# Patient Record
Sex: Female | Born: 1990 | Race: White | Hispanic: No | Marital: Single | Smoking: Former smoker
Health system: Southern US, Community
[De-identification: ages and names within clinical notes are randomized; demographics above are authoritative.]

## PROBLEM LIST (undated history)

## (undated) DIAGNOSIS — T7840XA Allergy, unspecified, initial encounter: Secondary | ICD-10-CM

## (undated) DIAGNOSIS — J353 Hypertrophy of tonsils with hypertrophy of adenoids: Secondary | ICD-10-CM

## (undated) HISTORY — PX: NO PAST SURGERIES: SHX2092

## (undated) HISTORY — DX: Allergy, unspecified, initial encounter: T78.40XA

---
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
## 2012-05-01 IMAGING — US ABDOMEN ULTRASOUND
1 series · 14 of 25 positions shown · non-contrast
Comparison: none

REASON FOR EXAM: nausea vomiting elevated liver enzymes
COMMENTS:

PROCEDURE:     US  - US ABDOMEN GENERAL SURVEY  - June 18, 2011  [DATE]
RESULT:

[Series 1: abdomen ultrasound · 0.31mm/px · 14 of 101 slices shown]
[im 1/101]
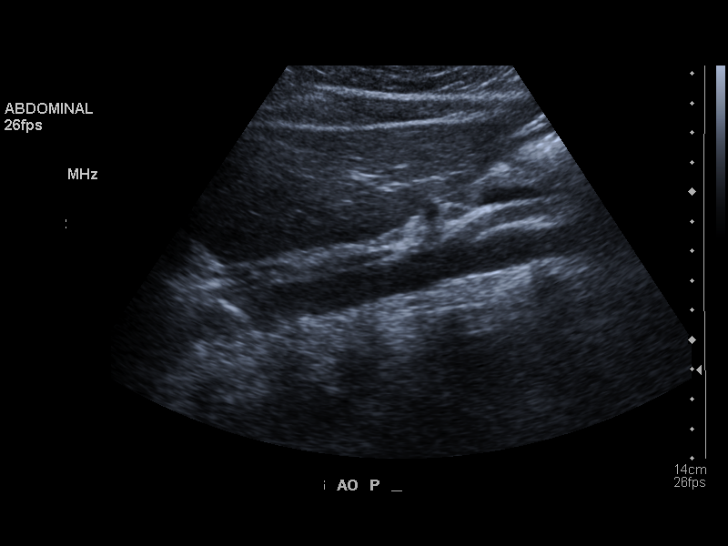
[im 9/101]
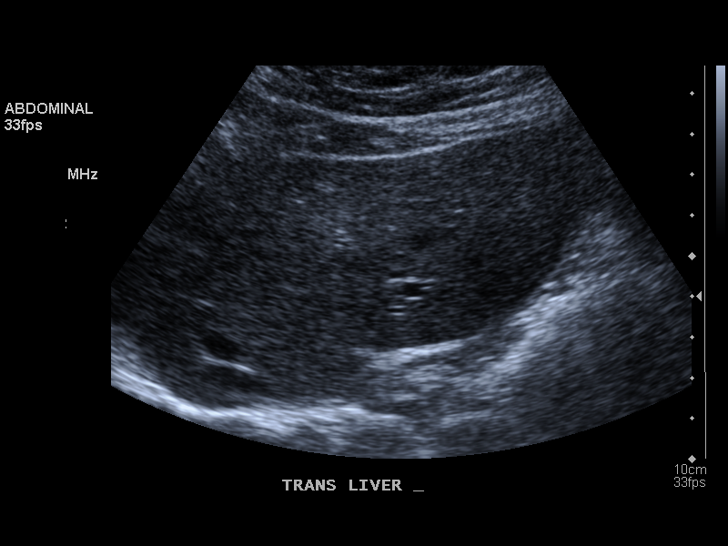
[im 17/101]
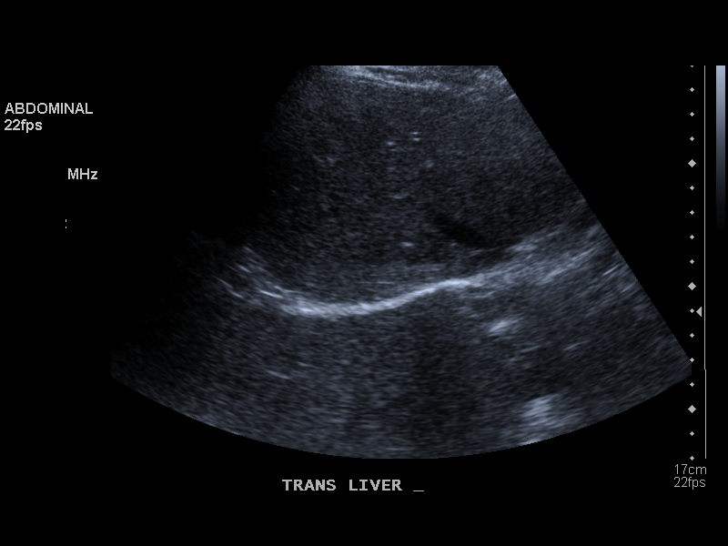
[im 26/101]
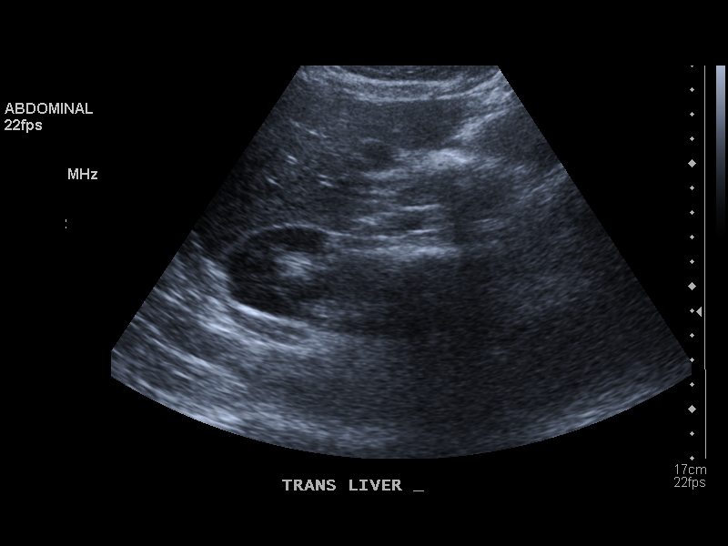
[im 34/101]
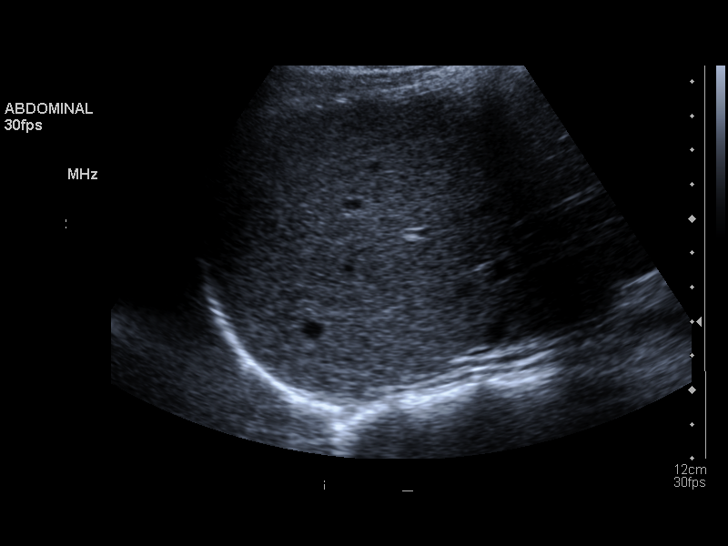
[im 38/101]
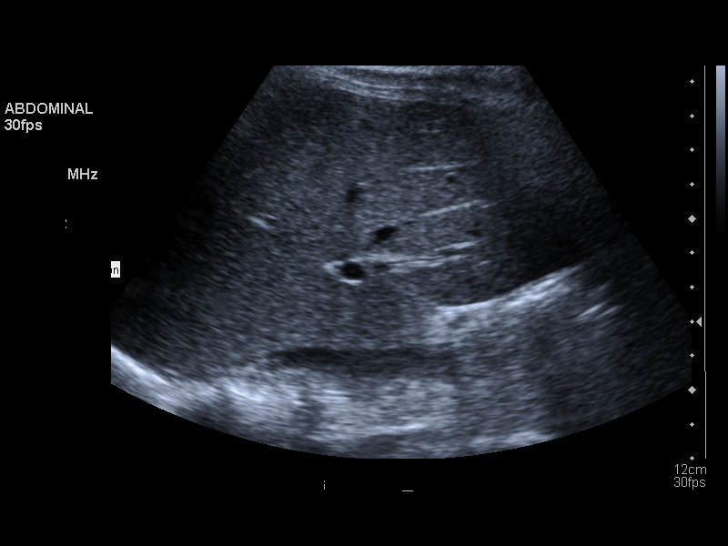
[im 46/101]
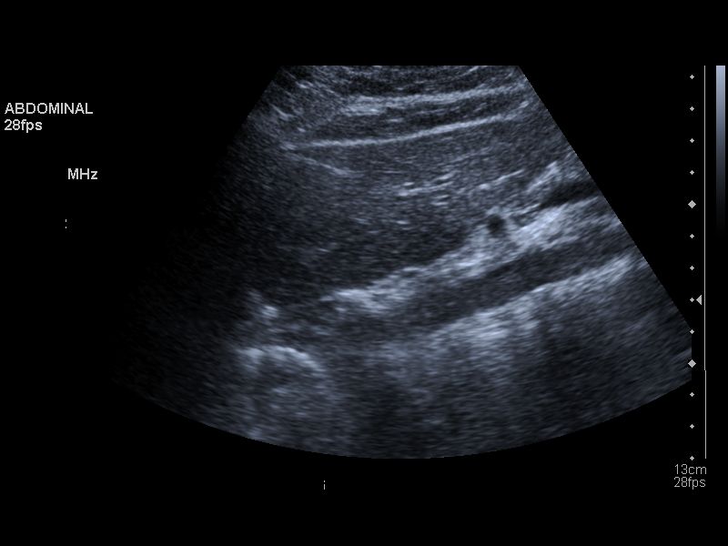
[im 55/101]
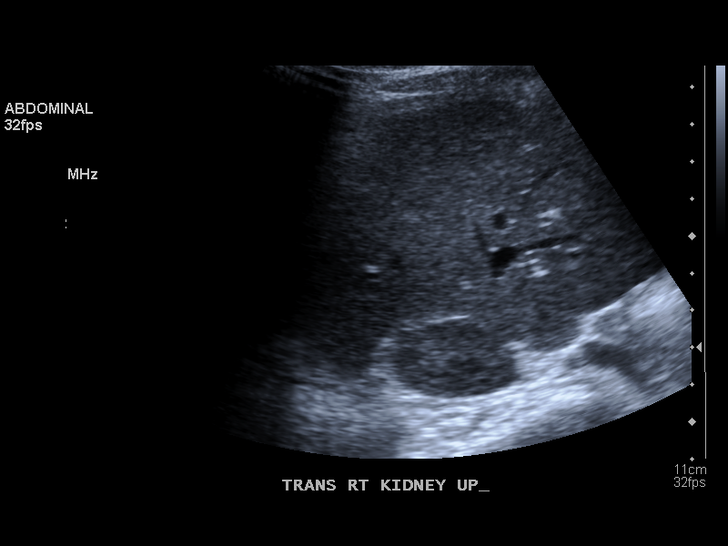
[im 63/101]
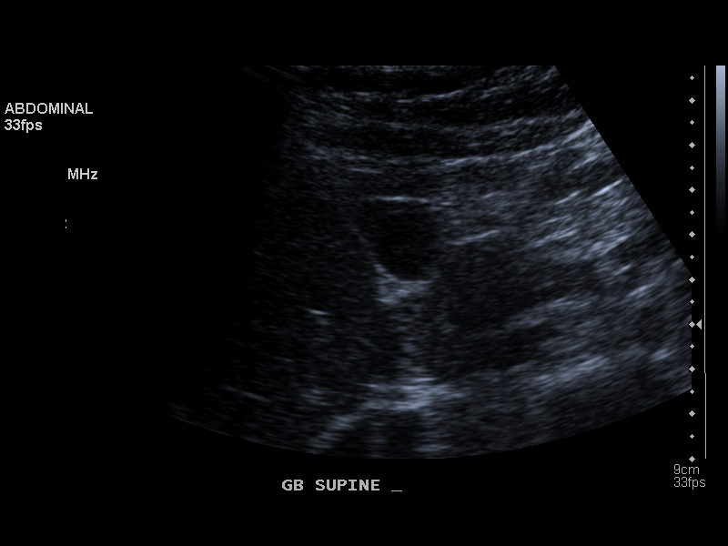
[im 67/101]
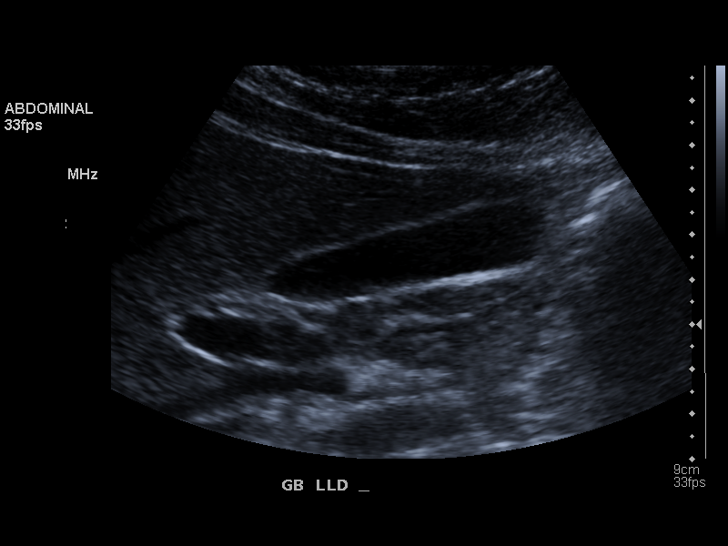
[im 76/101]
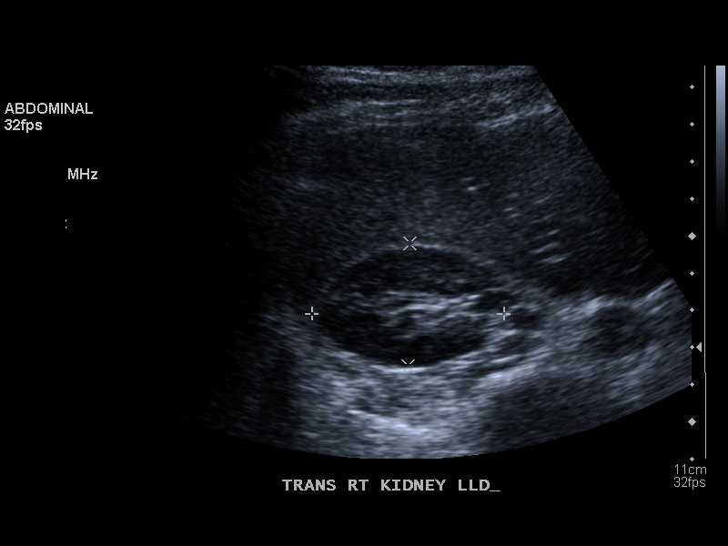
[im 84/101]
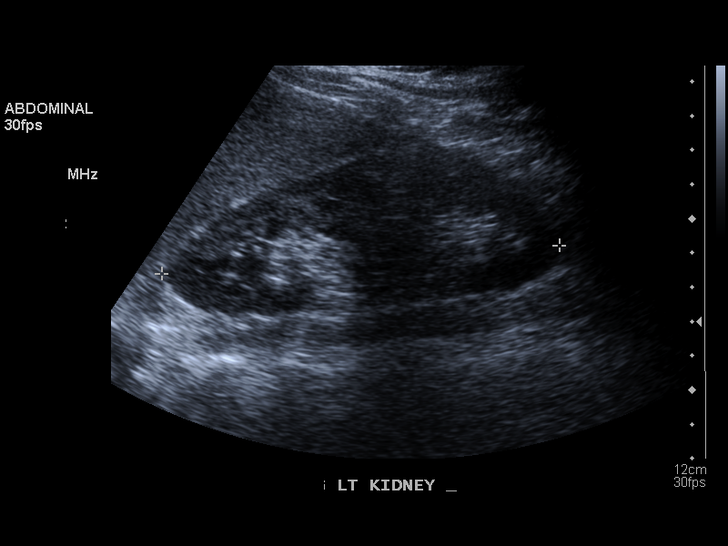
[im 92/101]
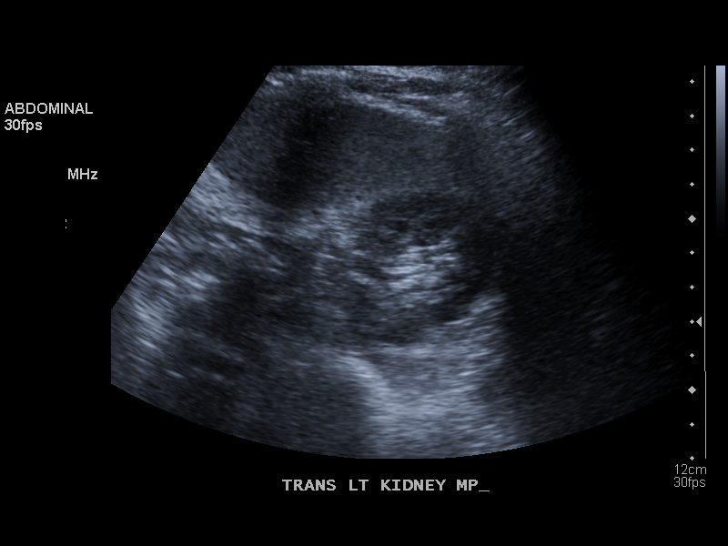
[im 101/101]
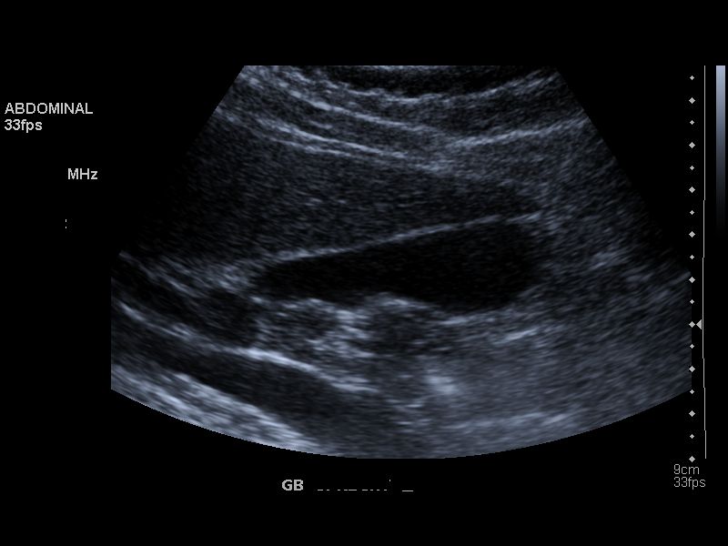

[14 of 25 positions shown; findings below may reference images not displayed]

FINDINGS: The liver demonstrates a homogeneous echotexture. The aorta and
IVC are unremarkable. The spleen is enlarged measuring 14.21 cm
demonstrating a homogeneous echotexture. An accessory spleen is identified.
The pancreas is unremarkable.

The gallbladder fossa demonstrates no evidence of pericholecystic fluid,
gallstones, sludging nor gallbladder wall thickening or a sonographic Murphy
sign. The gallbladder wall thickness is 1.2 mm. The common bile duct is
mm. There is no evidence of intrahepatic or extrahepatic biliary ductal
dilatation. The kidneys demonstrate appropriate corticomedullary
differentiation without evidence of hydronephrosis, masses or calculi. The
right kidney measures 11.05 x 5.17 x 3.31 cm and the left 12.29 x 5.04 x
4.63 cm.
IMPRESSION: A large spleen as well as an accessory spleen, clinical
correlation recommended. Otherwise, unremarkable abdominal ultrasound.

## 2013-12-23 ENCOUNTER — Ambulatory Visit: Payer: Self-pay | Admitting: Family Medicine

## 2014-03-11 ENCOUNTER — Ambulatory Visit: Payer: Self-pay | Admitting: Family Medicine

## 2014-04-20 ENCOUNTER — Ambulatory Visit: Payer: Self-pay | Admitting: Family Medicine

## 2015-01-14 ENCOUNTER — Telehealth: Payer: Self-pay | Admitting: Family Medicine

## 2015-01-14 NOTE — Telephone Encounter (Addendum)
Patient is up- date- date on Hep B, however there is no documentation of a TB test in all-scripts or epic. Patient notified.

## 2015-01-14 NOTE — Telephone Encounter (Signed)
Pt wants to know if she is current on her Hep B and how old her last TB test is. Thanks TNP

## 2015-04-11 ENCOUNTER — Ambulatory Visit (INDEPENDENT_AMBULATORY_CARE_PROVIDER_SITE_OTHER): Payer: BLUE CROSS/BLUE SHIELD | Admitting: Family Medicine

## 2015-04-11 ENCOUNTER — Encounter: Payer: Self-pay | Admitting: Family Medicine

## 2015-04-11 VITALS — BP 104/62 | HR 78 | Temp 98.4°F | Resp 16 | Ht 63.5 in | Wt 139.0 lb

## 2015-04-11 DIAGNOSIS — J101 Influenza due to other identified influenza virus with other respiratory manifestations: Secondary | ICD-10-CM | POA: Insufficient documentation

## 2015-04-11 DIAGNOSIS — Z7251 High risk heterosexual behavior: Secondary | ICD-10-CM | POA: Insufficient documentation

## 2015-04-11 DIAGNOSIS — J029 Acute pharyngitis, unspecified: Secondary | ICD-10-CM

## 2015-04-11 DIAGNOSIS — R748 Abnormal levels of other serum enzymes: Secondary | ICD-10-CM | POA: Insufficient documentation

## 2015-04-11 DIAGNOSIS — K625 Hemorrhage of anus and rectum: Secondary | ICD-10-CM | POA: Insufficient documentation

## 2015-04-11 DIAGNOSIS — N946 Dysmenorrhea, unspecified: Secondary | ICD-10-CM | POA: Insufficient documentation

## 2015-04-11 DIAGNOSIS — Z3009 Encounter for other general counseling and advice on contraception: Secondary | ICD-10-CM | POA: Insufficient documentation

## 2015-04-11 DIAGNOSIS — S6000XA Contusion of unspecified finger without damage to nail, initial encounter: Secondary | ICD-10-CM | POA: Insufficient documentation

## 2015-04-11 DIAGNOSIS — IMO0001 Reserved for inherently not codable concepts without codable children: Secondary | ICD-10-CM | POA: Insufficient documentation

## 2015-04-11 DIAGNOSIS — J069 Acute upper respiratory infection, unspecified: Secondary | ICD-10-CM | POA: Insufficient documentation

## 2015-04-11 DIAGNOSIS — H109 Unspecified conjunctivitis: Secondary | ICD-10-CM | POA: Insufficient documentation

## 2015-04-11 DIAGNOSIS — N926 Irregular menstruation, unspecified: Secondary | ICD-10-CM | POA: Insufficient documentation

## 2015-04-11 LAB — POCT RAPID STREP A (OFFICE): RAPID STREP A SCREEN: NEGATIVE

## 2015-04-11 NOTE — Progress Notes (Signed)
Patient ID: Alicia Jensen, female   DOB: 03/12/1991, 24 y.o.   MRN: [id number]       Patient: Alicia Aspermily Littman Female    DOB: 05/15/1990   24 y.o.   MRN: [id number] Visit Date: 04/11/2015  Today's Provider: Mila Merryonald Fisher, MD   Chief Complaint  Patient presents with  . Sore Throat    X 4 days.    Subjective:    Sore Throat  This is a new problem. The current episode started in the past 7 days. The problem has been unchanged. The pain is worse on the right side. There has been no fever. The pain is moderate. Associated symptoms include congestion, ear pain and a hoarse voice. Pertinent negatives include no coughing. She has had no exposure to strep or mono. She has tried NSAIDs for the symptoms. The treatment provided mild relief.   There has been no known strep of flu exposure.     Allergies  Allergen Reactions  . Ceclor  [Cefaclor]    Previous Medications   LEVONORGESTREL-ETHINYL ESTRADIOL (AVIANE) 0.1-20 MG-MCG TABLET    Take 1 tablet by mouth daily.    Review of Systems  Constitutional: Positive for appetite change and fatigue.  HENT: Positive for congestion, ear pain and hoarse voice.   Respiratory: Negative.  Negative for cough.     Social History  Substance Use Topics  . Smoking status: Not on file  . Smokeless tobacco: Not on file  . Alcohol Use: Not on file   Objective:   Wt 139 lb (63.05 kg)  Physical Exam  General Appearance:    Alert, cooperative, no distress  HENT:   bilateral TM normal without fluid or infection, neck has right anterior cervical nodes enlarged, pharynx erythematous without exudate, sinuses nontender and nasal mucosa congested  Eyes:    PERRL, conjunctiva/corneas clear, EOM's intact       Lungs:     Clear to auscultation bilaterally, respirations unlabored  Heart:    Regular rate and rhythm  Neurologic:   Awake, alert, oriented x 3. No apparent focal neurological           defect.       Results for orders placed or performed in visit on  04/11/15  POCT rapid strep A  Result Value Ref Range   Rapid Strep A Screen Negative Negative        Assessment & Plan:     1. Sore throat Counseled regarding signs and symptoms of viral and bacterial respiratory infections. Advised to call or return for additional evaluation if he develops any sign of bacterial infection, or if current symptoms last longer than 10 days.   - POCT rapid strep A       Mila Merryonald Fisher, MD  [facility]

## 2015-04-11 NOTE — Patient Instructions (Signed)

## 2015-04-15 ENCOUNTER — Telehealth: Payer: Self-pay | Admitting: Family Medicine

## 2015-04-15 MED ORDER — AZITHROMYCIN 250 MG PO TABS: ORAL_TABLET | ORAL | Status: AC

## 2015-04-15 NOTE — Telephone Encounter (Signed)
Patient was notified.

## 2015-04-15 NOTE — Telephone Encounter (Signed)
Have sent rx for zpack to [facility] for sinus infection.

## 2015-04-15 NOTE — Telephone Encounter (Signed)
Pt stated that she isn't feeling better she feels worse. Pharmacy: [facility]. Thanks TNP

## 2015-04-15 NOTE — Telephone Encounter (Signed)
Patient was seen 04/11/2015 for sore throat.

## 2015-04-15 NOTE — Telephone Encounter (Signed)
Patient still has sore throat and voice is very hoarse. Also has sinus pressure with low-grade fever-100.

## 2015-04-15 NOTE — Telephone Encounter (Signed)
Unable to reach pt. Will try again later.  

## 2015-04-15 NOTE — Telephone Encounter (Signed)
Need more information. What are her current symptoms. What symptoms are better/worse. Any fevers.Alicia Jensen. Etc.

## 2015-08-09 ENCOUNTER — Telehealth: Payer: Self-pay | Admitting: Family Medicine

## 2015-08-09 MED ORDER — TYPHOID VACCINE PO CPDR: 1.0000 | DELAYED_RELEASE_CAPSULE | ORAL | Status: DC

## 2015-08-09 NOTE — Telephone Encounter (Signed)
They give oral typhoid.

## 2015-08-09 NOTE — Telephone Encounter (Signed)
Do they give oral or injectable typhoid vaccine?

## 2015-08-09 NOTE — Telephone Encounter (Signed)
Pt would like to get an RX for Thphoid vaccine sent to [facility] and they will give her the vaccine. Pt stated she needs this b/c she is going out of the [place] in [place] for a [place] trip. Please advise. Thanks TNP

## 2015-08-09 NOTE — Telephone Encounter (Signed)
Please review. Thanks!  

## 2016-02-06 ENCOUNTER — Encounter: Payer: Self-pay | Admitting: Family Medicine

## 2016-02-06 ENCOUNTER — Ambulatory Visit (INDEPENDENT_AMBULATORY_CARE_PROVIDER_SITE_OTHER): Payer: BLUE CROSS/BLUE SHIELD | Admitting: Family Medicine

## 2016-02-06 VITALS — BP 110/62 | HR 86 | Temp 98.7°F | Resp 16 | Wt 141.0 lb

## 2016-02-06 DIAGNOSIS — B078 Other viral warts: Secondary | ICD-10-CM | POA: Diagnosis not present

## 2016-02-06 DIAGNOSIS — J018 Other acute sinusitis: Secondary | ICD-10-CM

## 2016-02-06 DIAGNOSIS — K602 Anal fissure, unspecified: Secondary | ICD-10-CM | POA: Diagnosis not present

## 2016-02-06 MED ORDER — HYDROCORTISONE 2.5 % EX CREA: TOPICAL_CREAM | Freq: Two times a day (BID) | CUTANEOUS | 0 refills | Status: DC

## 2016-02-06 MED ORDER — AMOXICILLIN 500 MG PO CAPS: 1000.0000 mg | ORAL_CAPSULE | Freq: Two times a day (BID) | ORAL | 0 refills | Status: AC

## 2016-02-06 NOTE — Progress Notes (Signed)
Patient: Alicia Jensen Female    DOB: 04-07-1991   24 y.o.   MRN: [id number] Visit Date: 02/06/2016  Today's Provider: Mila Merry, MD   Chief Complaint  Patient presents with  . Cough   Subjective:    Cough  This is a new problem. Episode onset: 1.5-2 weeks ago. The problem has been unchanged. The cough is productive of sputum. Associated symptoms include ear pain (right ear), nasal congestion and a sore throat (this morning). Pertinent negatives include no chest pain, chills, ear congestion, eye redness, fever, headaches, heartburn, hemoptysis, myalgias, postnasal drip, rash, rhinorrhea, shortness of breath, sweats, weight loss or wheezing. Nothing aggravates the symptoms. Treatments tried: DayQuil. The treatment provided no relief.   She also has several small warts on her hands that she would like removed.   She also reports she has been having some rectovaginal bleeding recently. She had similar symptoms in 2012 associated with rectal pain which was thought secondary to rectal fissure, and resolved with use of 2.5% hydrocortisone cream. She requests a refill for the medication. She does not want examination today.      Allergies  Allergen Reactions  . Ceclor  [Cefaclor]     No current outpatient prescriptions on file.  Review of Systems  Constitutional: Negative for appetite change, chills, diaphoresis, fatigue, fever and weight loss.  HENT: Positive for ear pain (right ear) and sore throat (this morning). Negative for congestion, postnasal drip and rhinorrhea.   Eyes: Negative for photophobia, pain, discharge, redness, itching and visual disturbance.  Respiratory: Positive for cough. Negative for hemoptysis, chest tightness, shortness of breath and wheezing.   Cardiovascular: Negative for chest pain and palpitations.  Gastrointestinal: Negative for abdominal pain, heartburn, nausea and vomiting.  Musculoskeletal: Negative for myalgias.  Skin: Negative for rash.      Multiple warts on both hands   Neurological: Negative for dizziness, weakness and headaches.    Social History  Substance Use Topics  . Smoking status: Never Smoker  . Smokeless tobacco: Never Used  . Alcohol use No   Objective:   BP 110/62 (BP Location: Left Arm, Patient Position: Sitting, Cuff Size: Normal)   Pulse 86   Temp 98.7 F (37.1 C) (Oral)   Resp 16   Wt 141 lb (64 kg)   SpO2 97% Comment: room air  BMI 24.59 kg/m   Physical Exam  General Appearance:    Alert, cooperative, no distress  HENT:   bilateral TM normal without fluid or infection, neck without nodes, pharynx erythematous without exudate, sinuses nontender, nasal mucosa congested and nasal mucosa pale and congested  Eyes:    PERRL, conjunctiva/corneas clear, EOM's intact       Lungs:     Clear to auscultation bilaterally, respirations unlabored  Heart:    Regular rate and rhythm  Neurologic:   Awake, alert, oriented x 3. No apparent focal neurological           defect.   Skin:   Small small warts on palms and dorsal aspect of both hands.        Assessment & Plan:     1. Acute non-recurrent sinusitis of other sinus  - amoxicillin (AMOXIL) 500 MG capsule; Take 2 capsules (1,000 mg total) by mouth 2 (two) times daily.  Dispense: 40 capsule; Refill: 0  2. Other viral warts  - Ambulatory referral to Dermatology  3. Rectal fissure Suspected based on history. Patient did not want exam today, but states she  would see gyn to examine her if symptoms does not resolve within the next week.  - hydrocortisone 2.5 % cream; Apply topically 2 (two) times daily.  Dispense: 30 g; Refill: 0       Mila Merryonald Khalen Styer, MD  [facility]

## 2016-02-29 ENCOUNTER — Telehealth: Payer: Self-pay | Admitting: Family Medicine

## 2016-02-29 NOTE — Telephone Encounter (Signed)
Immun record printed and ready to pick-up. Left message.

## 2016-02-29 NOTE — Telephone Encounter (Signed)
Pt is requesting a copy of her immunization records.  WU#[phone]/NFCB#[phone]/MW

## 2016-12-24 DIAGNOSIS — R300 Dysuria: Secondary | ICD-10-CM | POA: Diagnosis not present

## 2016-12-24 DIAGNOSIS — R102 Pelvic and perineal pain: Secondary | ICD-10-CM | POA: Diagnosis not present

## 2016-12-24 DIAGNOSIS — N912 Amenorrhea, unspecified: Secondary | ICD-10-CM | POA: Diagnosis not present

## 2017-01-22 DIAGNOSIS — N912 Amenorrhea, unspecified: Secondary | ICD-10-CM | POA: Diagnosis not present

## 2017-02-04 DIAGNOSIS — J353 Hypertrophy of tonsils with hypertrophy of adenoids: Secondary | ICD-10-CM | POA: Diagnosis not present

## 2017-02-04 DIAGNOSIS — J358 Other chronic diseases of tonsils and adenoids: Secondary | ICD-10-CM | POA: Diagnosis not present

## 2017-02-04 DIAGNOSIS — K219 Gastro-esophageal reflux disease without esophagitis: Secondary | ICD-10-CM | POA: Diagnosis not present

## 2017-03-14 DIAGNOSIS — J019 Acute sinusitis, unspecified: Secondary | ICD-10-CM | POA: Diagnosis not present

## 2017-03-14 DIAGNOSIS — J351 Hypertrophy of tonsils: Secondary | ICD-10-CM | POA: Diagnosis not present

## 2017-04-11 ENCOUNTER — Encounter: Payer: Self-pay | Admitting: *Deleted

## 2017-04-11 ENCOUNTER — Other Ambulatory Visit: Payer: Self-pay

## 2017-04-16 NOTE — Discharge Instructions (Signed)
T & A INSTRUCTION SHEET - [facility] °[facility] ° °CREIGHTON VAUGHT, MD °PAUL H. JUENGEL, MD  °P. [facility]EN[facility]T °CHAPMAN MCQUEEN, MD ° °[address] [place], [place] [address] TEL. [phone] °[address] [place] [address] [phone] ° °INFORMATION SHEET FOR A TONSILLECTOMY AND ADENDOIDECTOMY ° °About Your Tonsils and Adenoids ° The tonsils and adenoids are normal body tissues that are part of our immune system.  They normally help to protect us against diseases that may enter our mouth and nose.  However, sometimes the tonsils and/or adenoids become too large and obstruct our breathing, especially at night. °  ° If either of these things happen it helps to remove the tonsils and adenoids in order to become healthier. The operation to remove the tonsils and adenoids is called a tonsillectomy and adenoidectomy. ° °The Location of Your Tonsils and Adenoids ° The tonsils are located in the back of the throat on both side and sit in a cradle of muscles. The adenoids are located in the roof of the mouth, behind the nose, and closely associated with the opening of the Eustachian tube to the ear. ° °Surgery on Tonsils and Adenoids ° A tonsillectomy and adenoidectomy is a short operation which takes about thirty minutes.  This includes being put to sleep and being awakened.  Tonsillectomies and adenoidectomies are performed at [facility] and may require observation period in the recovery room prior to going home. ° °Following the Operation for a Tonsillectomy ° A cautery machine is used to control bleeding.  Bleeding from a tonsillectomy and adenoidectomy is minimal and postoperatively the risk of bleeding is approximately four percent, although this rarely life threatening. ° ° ° °After your tonsillectomy and adenoidectomy post-op care at home: ° °1. Our patients are able to go home the same day.  You may be given prescriptions for pain  medications and antibiotics, if indicated. °2. It is extremely important to remember that fluid intake is of utmost importance after a tonsillectomy.  The amount that you drink must be maintained in the postoperative period.  A good indication of whether a child is getting enough fluid is whether his/her urine output is constant.  As long as children are urinating or wetting their diaper every 6 - 8 hours this is usually enough fluid intake.   °3. Although rare, this is a risk of some bleeding in the first ten days after surgery.  This is usually occurs between day five and nine postoperatively.  This risk of bleeding is approximately four percent.  If you or your child should have any bleeding you should remain calm and notify our office or go directly to the Emergency Room at [facility] where they will contact us. Our doctors are available seven days a week for notification.  We recommend sitting up quietly in a chair, place an ice pack on the front of the neck and spitting out the blood gently until we are able to contact you.  Adults should gargle gently with ice water and this may help stop the bleeding.  If the bleeding does not stop after a short time, i.e. 10 to 15 minutes, or seems to be increasing again, please contact us or go to the hospital.   °4. It is common for the pain to be worse at 5 - 7 days postoperatively.  This occurs because the “scab” is peeling off and the mucous membrane (skin of   the throat) is growing back where the tonsils were.   °5. It is common for a low-grade fever, less than 102, during the first week after a tonsillectomy and adenoidectomy.  It is usually due to not drinking enough liquids, and we suggest your use liquid Tylenol or the pain medicine with Tylenol prescribed in order to keep your temperature below 102.  Please follow the directions on the back of the bottle. °6. Do not take aspirin or any products that contain aspirin such as Bufferin, Anacin,  Ecotrin, aspirin gum, Goodies, BC headache powders, etc., after a T&A because it can promote bleeding.  Please check with our office before administering any other medication that may been prescribed by other doctors during the two week post-operative period. °7. If you happen to look in the mirror or into your child’s mouth you will see white/gray patches on the back of the throat.  This is what a scab looks like in the mouth and is normal after having a T&A.  It will disappear once the tonsil area heals completely. However, it may cause a noticeable odor, and this too will disappear with time.     °8. You or your child may experience ear pain after having a T&A.  This is called referred pain and comes from the throat, but it is felt in the ears.  Ear pain is quite common and expected.  It will usually go away after ten days.  There is usually nothing wrong with the ears, and it is primarily due to the healing area stimulating the nerve to the ear that runs along the side of the throat.  Use either the prescribed pain medicine or Tylenol as needed.  °9. The throat tissues after a tonsillectomy are obviously sensitive.  Smoking around children who have had a tonsillectomy significantly increases the risk of bleeding.  DO NOT SMOKE!  ° °General Anesthesia, Adult, Care After °These instructions provide you with information about caring for yourself after your procedure. Your health care provider may also give you more specific instructions. Your treatment has been planned according to current medical practices, but problems sometimes occur. Call your health care provider if you have any problems or questions after your procedure. °What can I expect after the procedure? °After the procedure, it is common to have: °· Vomiting. °· A sore throat. °· Mental slowness. ° °It is common to feel: °· Nauseous. °· Cold or shivery. °· Sleepy. °· Tired. °· Sore or achy, even in parts of your body where you did not have  surgery. ° °Follow these instructions at home: °For at least 24 hours after the procedure: °· Do not: °? Participate in activities where you could fall or become injured. °? Drive. °? Use heavy machinery. °? Drink alcohol. °? Take sleeping pills or medicines that cause drowsiness. °? Make important decisions or sign legal documents. °? Take care of children on your own. °· Rest. °Eating and drinking °· If you vomit, drink water, juice, or soup when you can drink without vomiting. °· Drink enough fluid to keep your urine clear or pale yellow. °· Make sure you have little or no nausea before eating solid foods. °· Follow the diet recommended by your health care provider. °General instructions °· Have a responsible adult stay with you until you are awake and alert. °· Return to your normal activities as told by your health care provider. Ask your health care provider what activities are safe for you. °· Take over-the-counter and   prescription medicines only as told by your health care provider. °· If you smoke, do not smoke without supervision. °· Keep all follow-up visits as told by your health care provider. This is important. °Contact a health care provider if: °· You continue to have nausea or vomiting at home, and medicines are not helpful. °· You cannot drink fluids or start eating again. °· You cannot urinate after 8-12 hours. °· You develop a skin rash. °· You have fever. °· You have increasing redness at the site of your procedure. °Get help right away if: °· You have difficulty breathing. °· You have chest pain. °· You have unexpected bleeding. °· You feel that you are having a life-threatening or urgent problem. °This information is not intended to replace advice given to you by your health care provider. Make sure you discuss any questions you have with your health care provider. °Document Released: 07/30/2000 Document Revised: 09/26/2015 Document Reviewed: 04/07/2015 °Elsevier Interactive Patient Education  © 2018 [employer] ° °

## 2017-04-17 ENCOUNTER — Ambulatory Visit: Payer: BLUE CROSS/BLUE SHIELD | Admitting: Anesthesiology

## 2017-04-17 ENCOUNTER — Encounter
Admission: RE | Disposition: A | Discharge status: Home / Self Care | Payer: Self-pay | Source: Ambulatory Visit | Attending: Otolaryngology

## 2017-04-17 ENCOUNTER — Ambulatory Visit
Admission: RE | Admit: 2017-04-17 | Discharge: 2017-04-17 | Disposition: A | Discharge status: Home / Self Care | Payer: BLUE CROSS/BLUE SHIELD | Source: Ambulatory Visit | Attending: Otolaryngology | Admitting: Otolaryngology

## 2017-04-17 DIAGNOSIS — J351 Hypertrophy of tonsils: Secondary | ICD-10-CM | POA: Diagnosis not present

## 2017-04-17 DIAGNOSIS — Z833 Family history of diabetes mellitus: Secondary | ICD-10-CM | POA: Insufficient documentation

## 2017-04-17 DIAGNOSIS — A4289 Other forms of actinomycosis: Secondary | ICD-10-CM | POA: Diagnosis not present

## 2017-04-17 DIAGNOSIS — J3501 Chronic tonsillitis: Secondary | ICD-10-CM | POA: Diagnosis not present

## 2017-04-17 DIAGNOSIS — J358 Other chronic diseases of tonsils and adenoids: Secondary | ICD-10-CM | POA: Diagnosis not present

## 2017-04-17 DIAGNOSIS — Z79899 Other long term (current) drug therapy: Secondary | ICD-10-CM | POA: Insufficient documentation

## 2017-04-17 DIAGNOSIS — Z809 Family history of malignant neoplasm, unspecified: Secondary | ICD-10-CM | POA: Insufficient documentation

## 2017-04-17 DIAGNOSIS — Z87891 Personal history of nicotine dependence: Secondary | ICD-10-CM | POA: Diagnosis not present

## 2017-04-17 DIAGNOSIS — Z8249 Family history of ischemic heart disease and other diseases of the circulatory system: Secondary | ICD-10-CM | POA: Insufficient documentation

## 2017-04-17 DIAGNOSIS — J353 Hypertrophy of tonsils with hypertrophy of adenoids: Secondary | ICD-10-CM | POA: Insufficient documentation

## 2017-04-17 HISTORY — DX: Hypertrophy of tonsils with hypertrophy of adenoids: J35.3

## 2017-04-17 HISTORY — PX: TONSILLECTOMY AND ADENOIDECTOMY: SHX28

## 2017-04-17 SURGERY — TONSILLECTOMY AND ADENOIDECTOMY
Anesthesia: General | Site: Throat | Wound class: Clean Contaminated

## 2017-04-17 MED ORDER — ONDANSETRON HCL 4 MG PO TABS: 4.0000 mg | ORAL_TABLET | Freq: Three times a day (TID) | ORAL | 0 refills | Status: DC | PRN

## 2017-04-17 MED ORDER — MIDAZOLAM HCL 5 MG/5ML IJ SOLN
INTRAMUSCULAR | Status: DC | PRN
  Administered 2017-04-17: 2 mg via INTRAVENOUS

## 2017-04-17 MED ORDER — OXYMETAZOLINE HCL 0.05 % NA SOLN
NASAL | Status: DC | PRN
  Administered 2017-04-17: 1 via TOPICAL

## 2017-04-17 MED ORDER — SUCCINYLCHOLINE CHLORIDE 20 MG/ML IJ SOLN
INTRAMUSCULAR | Status: DC | PRN
  Administered 2017-04-17: 80 mg via INTRAVENOUS

## 2017-04-17 MED ORDER — PROPOFOL 10 MG/ML IV BOLUS
INTRAVENOUS | Status: DC | PRN
  Administered 2017-04-17: 150 mg via INTRAVENOUS

## 2017-04-17 MED ORDER — LIDOCAINE HCL (CARDIAC) 20 MG/ML IV SOLN
INTRAVENOUS | Status: DC | PRN
  Administered 2017-04-17: 40 mg via INTRAVENOUS

## 2017-04-17 MED ORDER — LACTATED RINGERS IV SOLN: INTRAVENOUS | Status: DC

## 2017-04-17 MED ORDER — LACTATED RINGERS IV SOLN
INTRAVENOUS | Status: DC
  Administered 2017-04-17: 10:00:00 via INTRAVENOUS

## 2017-04-17 MED ORDER — OXYCODONE HCL 5 MG PO TABS: 5.0000 mg | ORAL_TABLET | Freq: Once | ORAL | Status: AC | PRN

## 2017-04-17 MED ORDER — FENTANYL CITRATE (PF) 100 MCG/2ML IJ SOLN
25.0000 ug | INTRAMUSCULAR | Status: DC | PRN
  Administered 2017-04-17 (×2): 25 ug via INTRAVENOUS
  Administered 2017-04-17: 50 ug via INTRAVENOUS

## 2017-04-17 MED ORDER — GLYCOPYRROLATE 0.2 MG/ML IJ SOLN
INTRAMUSCULAR | Status: DC | PRN
  Administered 2017-04-17: 0.1 mg via INTRAVENOUS

## 2017-04-17 MED ORDER — DEXAMETHASONE SODIUM PHOSPHATE 4 MG/ML IJ SOLN
INTRAMUSCULAR | Status: DC | PRN
  Administered 2017-04-17: 10 mg via INTRAVENOUS

## 2017-04-17 MED ORDER — SCOPOLAMINE 1 MG/3DAYS TD PT72
1.0000 | MEDICATED_PATCH | TRANSDERMAL | Status: DC
  Administered 2017-04-17: 1.5 mg via TRANSDERMAL

## 2017-04-17 MED ORDER — OXYCODONE HCL 5 MG/5ML PO SOLN
5.0000 mg | Freq: Once | ORAL | Status: AC | PRN
  Administered 2017-04-17: 5 mg via ORAL

## 2017-04-17 MED ORDER — FENTANYL CITRATE (PF) 100 MCG/2ML IJ SOLN
INTRAMUSCULAR | Status: DC | PRN
  Administered 2017-04-17: 100 ug via INTRAVENOUS

## 2017-04-17 MED ORDER — ONDANSETRON HCL 4 MG/2ML IJ SOLN
INTRAMUSCULAR | Status: DC | PRN
  Administered 2017-04-17: 4 mg via INTRAVENOUS

## 2017-04-17 MED ORDER — OXYCODONE HCL 5 MG/5ML PO SOLN: 10.0000 mg | ORAL | 0 refills | Status: DC | PRN

## 2017-04-17 MED ORDER — LIDOCAINE VISCOUS 2 % MT SOLN: 10.0000 mL | Freq: Four times a day (QID) | OROMUCOSAL | 0 refills | Status: DC | PRN

## 2017-04-17 MED ORDER — ACETAMINOPHEN 10 MG/ML IV SOLN
1000.0000 mg | Freq: Once | INTRAVENOUS | Status: AC
  Administered 2017-04-17: 1000 mg via INTRAVENOUS

## 2017-04-17 MED ORDER — ONDANSETRON HCL 4 MG/2ML IJ SOLN: 4.0000 mg | Freq: Once | INTRAMUSCULAR | Status: DC | PRN

## 2017-04-17 SURGICAL SUPPLY — 17 items
BLADE BOVIE TIP EXT 4 (BLADE) ×2 IMPLANT
CANISTER SUCT 1200ML W/VALVE (MISCELLANEOUS) ×2 IMPLANT
CATH ROBINSON RED A/P 10FR (CATHETERS) ×2 IMPLANT
COAG SUCT 10F 3.5MM HAND CTRL (MISCELLANEOUS) ×2 IMPLANT
GLOVE BIO SURGEON STRL SZ7.5 (GLOVE) ×2 IMPLANT
HANDLE SUCTION POOLE (INSTRUMENTS) ×1 IMPLANT
KIT ROOM TURNOVER OR (KITS) ×2 IMPLANT
NEEDLE HYPO 25GX1X1/2 BEV (NEEDLE) ×2 IMPLANT
NS IRRIG 500ML POUR BTL (IV SOLUTION) ×2 IMPLANT
PACK TONSIL/ADENOIDS (PACKS) ×2 IMPLANT
PAD GROUND ADULT SPLIT (MISCELLANEOUS) ×2 IMPLANT
PENCIL ELECTRO HAND CTR (MISCELLANEOUS) ×2 IMPLANT
SOL ANTI-FOG 6CC FOG-OUT (MISCELLANEOUS) ×1 IMPLANT
SOL FOG-OUT ANTI-FOG 6CC (MISCELLANEOUS) ×1
STRAP BODY AND KNEE 60X3 (MISCELLANEOUS) ×2 IMPLANT
SUCTION POOLE HANDLE (INSTRUMENTS) ×2
SYR 5ML LL (SYRINGE) ×2 IMPLANT

## 2017-04-17 NOTE — Transfer of Care (Signed)
Immediate Anesthesia Transfer of Care Note  Patient: Alicia Jensen  Procedure(s) Performed: TONSILLECTOMY AND ADENOIDECTOMY (N/A Throat)  Patient Location: PACU  Anesthesia Type: General  Level of Consciousness: awake, alert  and patient cooperative  Airway and Oxygen Therapy: Patient Spontanous Breathing and Patient connected to supplemental oxygen  Post-op Assessment: Post-op Vital signs reviewed, Patient's Cardiovascular Status Stable, Respiratory Function Stable, Patent Airway and No signs of Nausea or vomiting  Post-op Vital Signs: Reviewed and stable  Complications: No apparent anesthesia complications

## 2017-04-17 NOTE — Op Note (Signed)
..  04/17/2017  10:43 AM    Alicia AsperBeverly, Tomeeka  [id number]   Pre-Op Dx:  Lawrence MarseillesNSILLOLITHIASIS  HYPERTROPHY OF TONSILS AND ADENOIDS  Post-op Dx: TONSILLOLITHIASIS  HYPERTROPHY OF TONSILS AND ADENOIDS  Proc:Tonsillectomy and Adenoidectomy > age 24  Surg: Climmie Cronce  Anes:  General Endotracheal  EBL:  <245ml  Comp:  None  Findings:  3+ cryptic tonsils with tonsillolithiasis, 2+ partially obstructive adenoids.  Procedure: After the patient was identified in holding and the history and physical and consent was reviewed, the patient was taken to the operating room and placed in a supine position.  General endotracheal anesthesia was induced in the normal fashion.  At this time, the patient was rotated 45 degrees and a shoulder roll was placed.  At this time, a McIvor mouthgag was inserted into the patient's oral cavity and suspended from the Mayo stand without injury to teeth, lips, or gums.  Next a red rubber catheter was inserted into the patient left nostril for retraction of the uvula and soft palate superiorly.  Next a curved Alice clamp was attached to the patient's right superior tonsillar pole and retracted medially and inferiorly.  A Bovie electrocautery was used to dissect the patient's right tonsil in a subcapsular plane.  Meticulous hemostasis was achieved with Bovie suction cautery.  At this time, the mouth gag was released from suspension for 1 minute.  Attention now was directed to the patient's left side.  In a similar fashion the curved Alice clamp was attached to the superior pole and this was retracted medially and inferiorly and the tonsil was excised in a subcapsular plane with Bovie electrocautery.  After completion of the second tonsil, meticulous hemostasis was continued.  At this time, attention was directed to the patient's Adenoidectomy.  Under indirect visualization using an operating mirror, the adenoid tissue was visualized and noted to be obstructive in nature.   Using a Bovie suction cautery the adenoid tissue was ablated.  Meticulous hemostasis was continued.  At this time, the patient's nasal cavity and oral cavity was irrigated with sterile saline.   Following this  The care of patient was returned to anesthesia, awakened, and transferred to recovery in stable condition.  Dispo:  PACU to home  Plan: Soft diet.  Limit exercise and strenuous activity for 2 weeks.  Fluid hydration  Recheck my office three weeks.   Alicia Jensen 10:43 AM 04/17/2017

## 2017-04-17 NOTE — H&P (Signed)
..  History and Physical paper copy reviewed and updated date of procedure and will be scanned into system.  Patient seen and examined.  

## 2017-04-17 NOTE — Anesthesia Preprocedure Evaluation (Signed)
Anesthesia Evaluation  Patient identified by MRN, date of birth, ID band Patient awake    History of Anesthesia Complications Negative for: history of anesthetic complications  Airway Mallampati: I  TM Distance: >3 FB Neck ROM: Full    Dental no notable dental hx.    Pulmonary former smoker (quit 2015),    Pulmonary exam normal breath sounds clear to auscultation       Cardiovascular Exercise Tolerance: Good negative cardio ROS Normal cardiovascular exam Rhythm:Regular Rate:Normal     Neuro/Psych negative neurological ROS     GI/Hepatic negative GI ROS,   Endo/Other  negative endocrine ROS  Renal/GU negative Renal ROS     Musculoskeletal   Abdominal   Peds  Hematology negative hematology ROS (+)   Anesthesia Other Findings adenotonsillar hypertrophy  Reproductive/Obstetrics                             Anesthesia Physical Anesthesia Plan  ASA: II  Anesthesia Plan: General   Post-op Pain Management:    Induction: Intravenous  PONV Risk Score and Plan: 2 and Scopolamine patch - Pre-op, Dexamethasone and Ondansetron  Airway Management Planned: Oral ETT  Additional Equipment:   Intra-op Plan:   Post-operative Plan: Extubation in OR  Informed Consent: I have reviewed the patients History and Physical, chart, labs and discussed the procedure including the risks, benefits and alternatives for the proposed anesthesia with the patient or authorized representative who has indicated his/her understanding and acceptance.     Plan Discussed with: CRNA  Anesthesia Plan Comments:         Anesthesia Quick Evaluation

## 2017-04-17 NOTE — Anesthesia Postprocedure Evaluation (Signed)
Anesthesia Post Note  Patient: Alicia Jensen  Procedure(s) Performed: TONSILLECTOMY AND ADENOIDECTOMY (N/A Throat)  Patient location during evaluation: PACU Anesthesia Type: General Level of consciousness: awake and alert, oriented and patient cooperative Pain management: pain level controlled Vital Signs Assessment: post-procedure vital signs reviewed and stable Respiratory status: spontaneous breathing, nonlabored ventilation and respiratory function stable Cardiovascular status: blood pressure returned to baseline and stable Postop Assessment: adequate PO intake Anesthetic complications: no    Reed BreechAndrea Jaire Pinkham

## 2017-04-17 NOTE — Anesthesia Procedure Notes (Signed)
Procedure Name: Intubation Date/Time: 04/17/2017 10:26 AM Performed by: Jimmy PicketAmyot, Lesbia Ottaway, CRNA Pre-anesthesia Checklist: Patient identified, Emergency Drugs available, Suction available, Patient being monitored and Timeout performed Patient Re-evaluated:Patient Re-evaluated prior to induction Oxygen Delivery Method: Circle system utilized Preoxygenation: Pre-oxygenation with 100% oxygen Induction Type: IV induction Ventilation: Mask ventilation without difficulty Laryngoscope Size: Miller and 2 Grade View: Grade I Tube type: Oral Rae Tube size: 7.0 mm Number of attempts: 1 Placement Confirmation: ETT inserted through vocal cords under direct vision,  positive ETCO2 and breath sounds checked- equal and bilateral Tube secured with: Tape Dental Injury: Teeth and Oropharynx as per pre-operative assessment

## 2017-04-18 ENCOUNTER — Encounter: Payer: Self-pay | Admitting: Otolaryngology

## 2017-04-19 LAB — SURGICAL PATHOLOGY

## 2017-07-02 ENCOUNTER — Ambulatory Visit: Payer: BLUE CROSS/BLUE SHIELD | Admitting: Physician Assistant

## 2017-07-02 ENCOUNTER — Encounter: Payer: Self-pay | Admitting: Physician Assistant

## 2017-07-02 VITALS — BP 122/86 | HR 92 | Temp 98.6°F | Resp 16 | Wt 142.0 lb

## 2017-07-02 DIAGNOSIS — N898 Other specified noninflammatory disorders of vagina: Secondary | ICD-10-CM

## 2017-07-02 DIAGNOSIS — Z7721 Contact with and (suspected) exposure to potentially hazardous body fluids: Secondary | ICD-10-CM

## 2017-07-02 NOTE — Patient Instructions (Signed)

## 2017-07-02 NOTE — Progress Notes (Signed)
Patient: Alicia Jensen Female    DOB: 03/17/1991   26 y.o.   MRN: [id number] Visit Date: 07/02/2017  Today's Provider: Trey SailorsAdriana M Pollak, PA-C   Chief Complaint  Patient presents with  . Vaginal Discharge    Started in the beginnig of Feb   Subjective:    Alicia Jensen is a 27 y/o woman presenting today for bloody vaginal discharge that began at the beginning of the month. The bloody discharge resolved and now she has clear discharge, she does not an odor. Pertinent info below. Last sexual activity was three years ago.  Additionally, she is a Sales executivedental assistant and stuck herself with a needle of an HIV positive patient at work one year ago. She was placed on truvada and isintress as postexposure prophylactically and was followed with serial tests by health department but did not get last test.   Vaginal Discharge  The patient's primary symptoms include pelvic pain, vaginal bleeding and vaginal discharge. This is a new problem. The current episode started 1 to 4 weeks ago. The problem has been unchanged. Associated symptoms include nausea (Especially today) and a sore throat. Pertinent negatives include no abdominal pain, anorexia, back pain, chills, constipation, diarrhea, discolored urine, dysuria, fever, flank pain, frequency, headaches, hematuria, joint pain, joint swelling, painful intercourse, rash, urgency or vomiting. The vaginal discharge was bloody and clear. The vaginal bleeding is typical of menses. She has not been passing clots. She has not been passing tissue. She uses abstinence for contraception.       Allergies  Allergen Reactions  . Ceclor [Cefaclor] Rash     Current Outpatient Medications:  .  lidocaine (XYLOCAINE) 2 % solution, Use as directed 10 mLs in the mouth or throat every 6 (six) hours as needed for mouth pain (Swish and spit)., Disp: 250 mL, Rfl: 0 .  Multiple Vitamin (MULTIVITAMIN) tablet, Take 1 tablet by mouth daily., Disp: , Rfl:  .  ondansetron  (ZOFRAN) 4 MG tablet, Take 1 tablet (4 mg total) by mouth every 8 (eight) hours as needed for up to 10 doses for nausea or vomiting., Disp: 20 tablet, Rfl: 0 .  oxyCODONE (ROXICODONE) 5 MG/5ML solution, Take 10 mLs (10 mg total) by mouth every 4 (four) hours as needed for severe pain., Disp: 400 mL, Rfl: 0  Review of Systems  Constitutional: Negative.  Negative for chills and fever.  HENT: Positive for sore throat.   Gastrointestinal: Positive for nausea (Especially today). Negative for abdominal pain, anorexia, constipation, diarrhea and vomiting.  Genitourinary: Positive for pelvic pain, vaginal bleeding, vaginal discharge and vaginal pain. Negative for decreased urine volume, difficulty urinating, dyspareunia, dysuria, enuresis, flank pain, frequency, genital sores, hematuria, menstrual problem and urgency.  Musculoskeletal: Negative for back pain and joint pain.  Skin: Negative for rash.  Neurological: Negative for dizziness and headaches.    Social History   Tobacco Use  . Smoking status: Former Smoker    Types: Cigarettes    Last attempt to quit: 03/07/2014    Years since quitting: 3.3  . Smokeless tobacco: Never Used  . Tobacco comment: smoked socially  Substance Use Topics  . Alcohol use: No    Alcohol/week: 0.0 oz   Objective:   BP 122/86 (BP Location: Right Arm, Patient Position: Sitting, Cuff Size: Normal)   Pulse 92   Temp 98.6 F (37 C) (Oral)   Resp 16   Wt 142 lb (64.4 kg)   BMI 24.76 kg/m  Vitals:  07/02/17 1614  BP: 122/86  Pulse: 92  Resp: 16  Temp: 98.6 F (37 C)  TempSrc: Oral  Weight: 142 lb (64.4 kg)     Physical Exam  Constitutional: She is oriented to person, place, and time. She appears well-developed and well-nourished.  Non-toxic appearance. She does not have a sickly appearance. She does not appear ill.  Cardiovascular: Normal rate.  Pulmonary/Chest: Effort normal.  Abdominal: Soft. Bowel sounds are normal. She exhibits no distension.  There is no tenderness. There is no rebound and no guarding.  Genitourinary: There is no rash, tenderness, lesion or injury on the right labia. There is no rash, tenderness, lesion or injury on the left labia. Uterus is not deviated, not enlarged, not fixed and not tender. Cervix exhibits no motion tenderness, no discharge and no friability. Right adnexum displays no mass, no tenderness and no fullness. Left adnexum displays no mass, no tenderness and no fullness. No erythema, tenderness or bleeding in the vagina. No foreign body in the vagina. No signs of injury around the vagina. No vaginal discharge found.  Genitourinary Comments: Some clear discharge noted in vaginal vault.   Neurological: She is alert and oriented to person, place, and time.  Skin: Skin is warm and dry.  Psychiatric: She has a normal mood and affect. Her behavior is normal.        Assessment & Plan:     1. Vaginal discharge  Urine pregnancy negative. Patient does not appear ill today. Will swab as below for STI and other vaginitis.   - NuSwab Vaginitis Plus (VG+)  - POCT urine pregnancy  2. Exposure to blood  - HIV antibody (with reflex) - Hepatitis C antibody  Return if symptoms worsen or fail to improve.  The entirety of the information documented in the History of Present Illness, Review of Systems and Physical Exam were personally obtained by me. Portions of this information were initially documented by Kavin Leech, CMA and reviewed by me for thoroughness and accuracy.           Trey Sailors, PA-C  [facility]

## 2017-07-03 ENCOUNTER — Telehealth: Payer: Self-pay

## 2017-07-03 LAB — POCT URINE PREGNANCY: Preg Test, Ur: NEGATIVE

## 2017-07-03 LAB — HIV ANTIBODY (ROUTINE TESTING W REFLEX): HIV Screen 4th Generation wRfx: NONREACTIVE

## 2017-07-03 LAB — HEPATITIS C ANTIBODY: Hep C Virus Ab: <0.1 s/co ratio (ref 0.0–0.9)

## 2017-07-03 NOTE — Telephone Encounter (Signed)
LMTCB 07/03/2017  Thanks,   -Lowell Makara  

## 2017-07-03 NOTE — Telephone Encounter (Signed)
-----   Message from Trey SailorsAdriana M Pollak, [place]-C sent at 07/03/2017  8:32 AM EST ----- HIV and Hepatitis C nonreactive.

## 2017-07-03 NOTE — Telephone Encounter (Signed)
Pt advised.   Thanks,   -Timmothy Baranowski  

## 2017-07-04 LAB — NUSWAB VAGINITIS PLUS (VG+)
Candida albicans, NAA: NEGATIVE
Candida glabrata, NAA: NEGATIVE
Chlamydia trachomatis, NAA: NEGATIVE
Neisseria gonorrhoeae, NAA: NEGATIVE
Trich vag by NAA: NEGATIVE

## 2017-07-05 ENCOUNTER — Telehealth: Payer: Self-pay

## 2017-07-05 NOTE — Telephone Encounter (Signed)
Pt returned call and stated if after 3 pm please call her at work# [phone]. Thanks TNP

## 2017-07-05 NOTE — Telephone Encounter (Signed)
-----   Message from Trey SailorsAdriana M Pollak, [place] sent at 07/05/2017 11:12 AM EST ----- Nuswab came back negative for all yeast, bacterial vaginosis, trichomonas, chlamydia, and gonorrhea. Would continue to observe for any symptoms.

## 2017-07-05 NOTE — Telephone Encounter (Signed)
LMTCB 07/05/2017  Thanks,   -Annikah Lovins  

## 2017-07-05 NOTE — Telephone Encounter (Signed)
Pt advised.   Thanks,   -Laura  

## 2017-07-05 NOTE — Telephone Encounter (Signed)
-----   Message from Adriana M Pollak, PA-C sent at 07/05/2017 11:12 AM EST ----- Nuswab came back negative for all yeast, bacterial vaginosis, trichomonas, chlamydia, and gonorrhea. Would continue to observe for any symptoms. 

## 2017-07-15 ENCOUNTER — Telehealth: Payer: Self-pay | Admitting: Physician Assistant

## 2017-07-15 NOTE — Telephone Encounter (Signed)
Patient called wanting to know what the next step was as far as her issues with pelvic pain and bloody discharge.   Does she need a referral to OB/GYN or birth control?

## 2017-07-15 NOTE — Telephone Encounter (Signed)
[facility] Does she have any preferences?

## 2017-07-16 NOTE — Telephone Encounter (Signed)
LMTCB 07/16/2017  Thanks,   -Lawonda Pretlow  

## 2017-07-17 ENCOUNTER — Telehealth: Payer: Self-pay | Admitting: Physician Assistant

## 2017-07-17 NOTE — Telephone Encounter (Signed)
Patient is returning a call to Vernona RiegerLaura, please call her at [phone]

## 2017-07-18 NOTE — Telephone Encounter (Signed)
Pt advised.  She states she started at GYN and saw [facility].  She states "they did not/will not help her".  (See in Care everywhere)  It looks like Dr. Feliberto GottronSchermerhorn recommended to start an OCP.   Thanks,   -Vernona RiegerLaura

## 2017-07-18 NOTE — Telephone Encounter (Signed)
LMTCB 07/18/2017  Contact number is: [phone]  Thanks,   -Vernona RiegerLaura

## 2017-07-22 NOTE — Telephone Encounter (Signed)
I have reviewed the OBGYN records from Dr. Feliberto GottronSchermerhorn and looked over lab reports, which all came back within normal limits. I do see that he recommended OCPs to regulate cycles and I do agree that this could be useful. If she would like to do a trial of OCPs, please have her come in to schedule OV so we can discuss this.

## 2017-07-23 ENCOUNTER — Encounter: Payer: Self-pay | Admitting: Physician Assistant

## 2017-07-23 ENCOUNTER — Ambulatory Visit: Payer: BLUE CROSS/BLUE SHIELD | Admitting: Physician Assistant

## 2017-07-23 VITALS — BP 122/70 | HR 76 | Temp 98.7°F | Resp 16 | Wt 135.0 lb

## 2017-07-23 DIAGNOSIS — N915 Oligomenorrhea, unspecified: Secondary | ICD-10-CM | POA: Diagnosis not present

## 2017-07-23 DIAGNOSIS — R110 Nausea: Secondary | ICD-10-CM | POA: Diagnosis not present

## 2017-07-23 MED ORDER — NORETHIN-ETH ESTRAD-FE BIPHAS 1 MG-10 MCG / 10 MCG PO TABS: 1.0000 | ORAL_TABLET | Freq: Every day | ORAL | 4 refills | Status: DC

## 2017-07-23 NOTE — Patient Instructions (Signed)

## 2017-07-23 NOTE — Progress Notes (Signed)
Patient: Alicia Jensen Female    DOB: 11/04/1990   26 y.o.   MRN: [id number] Visit Date: 07/23/2017  Today's Provider: Trey SailorsAdriana M Pollak, PA-C   Chief Complaint  Patient presents with  . Contraception   Subjective:    HPI   Alicia Jensen is a 27 y/o woman with history of oligomenorrhea, pelvic pain and daily nausea presenting here for follow up of the same. [facility]. Her labwork including her Prolactin, HSV, LH, FSH, DHEA-SO4, and total testosterone were normal. Her thyroid panel was normal. She was recommended to being OCPs if symptoms did not resolve, but declined at the time.   Presents today with continued oligomenorrhea, though states she just had her menstrual cycle. She is not sexually active. She does not report any excessive hair growth except for her lip and chin. She does not report any acne.   She also reports daily nausea x 2 months. This is not affected by eating. She does not vomit. Does not have epigastric pain or cough. She has been taking a new multivitamin recently.      Allergies  Allergen Reactions  . Ceclor [Cefaclor] Rash     Current Outpatient Medications:  Marland Kitchen.  Multiple Vitamin (MULTIVITAMIN) tablet, Take 1 tablet by mouth daily., Disp: , Rfl:  .  lidocaine (XYLOCAINE) 2 % solution, Use as directed 10 mLs in the mouth or throat every 6 (six) hours as needed for mouth pain (Swish and spit). (Patient not taking: Reported on 07/23/2017), Disp: 250 mL, Rfl: 0 .  ondansetron (ZOFRAN) 4 MG tablet, Take 1 tablet (4 mg total) by mouth every 8 (eight) hours as needed for up to 10 doses for nausea or vomiting. (Patient not taking: Reported on 07/23/2017), Disp: 20 tablet, Rfl: 0 .  oxyCODONE (ROXICODONE) 5 MG/5ML solution, Take 10 mLs (10 mg total) by mouth every 4 (four) hours as needed for severe pain. (Patient not taking: Reported on 07/23/2017), Disp: 400 mL, Rfl: 0  Review of Systems  Social History    Tobacco Use  . Smoking status: Former Smoker    Types: Cigarettes    Last attempt to quit: 03/07/2014    Years since quitting: 3.3  . Smokeless tobacco: Never Used  . Tobacco comment: smoked socially  Substance Use Topics  . Alcohol use: No    Alcohol/week: 0.0 oz   Objective:   BP 122/70 (BP Location: Right Arm, Patient Position: Sitting, Cuff Size: Normal)   Pulse 76   Temp 98.7 F (37.1 C) (Oral)   Resp 16   Wt 135 lb (61.2 kg)   LMP 07/15/2017   BMI 23.54 kg/m  Vitals:   07/23/17 1541  BP: 122/70  Pulse: 76  Resp: 16  Temp: 98.7 F (37.1 C)  TempSrc: Oral  Weight: 135 lb (61.2 kg)     Physical Exam  Constitutional: She is oriented to person, place, and time. She appears well-developed and well-nourished.  Cardiovascular: Normal rate and regular rhythm.  Pulmonary/Chest: Effort normal and breath sounds normal.  Abdominal: Soft. Bowel sounds are normal.  Neurological: She is alert and oriented to person, place, and time.  Skin: Skin is warm and dry.  Psychiatric: She has a normal mood and affect. Her behavior is normal.        Assessment & Plan:     1. Oligomenorrhea, unspecified type  Urine pregnancy negative. Advised of irregular bleeding x 3 mo as well  as additional contraception, including IUD, that may cease menstruation. Low dose to avoid nausea, may increase if BTB not controlled. Quick start vs. 1st day of last menstrual period. Discussed potential continuous use of OCP to avoid periods at all.   - Norethindrone-Ethinyl Estradiol-Fe Biphas (LO LOESTRIN FE) 1 MG-10 MCG / 10 MCG tablet; Take 1 tablet by mouth daily.  Dispense: 3 Package; Refill: 4 -POCT urine pregnancy  2. Nausea  Recommend trial of stopping multivitamin to see if this has been causing nausea. Otherwise, no alarm symptoms.   Return in about 3 months (around 10/23/2017).  The entirety of the information documented in the History of Present Illness, Review of Systems and Physical  Exam were personally obtained by me. Portions of this information were initially documented by Kavin Leech, CMA and reviewed by me for thoroughness and accuracy.        Trey Sailors, PA-C  [facility]

## 2017-07-23 NOTE — Telephone Encounter (Signed)
Apt made for this afternoon at 3:40.  Thanks,   -Vernona RiegerLaura

## 2017-07-24 LAB — POCT URINE PREGNANCY: Preg Test, Ur: NEGATIVE

## 2017-09-04 ENCOUNTER — Telehealth: Payer: Self-pay | Admitting: Family Medicine

## 2017-09-04 DIAGNOSIS — N915 Oligomenorrhea, unspecified: Secondary | ICD-10-CM

## 2017-09-04 NOTE — Telephone Encounter (Signed)
Was prescribed this on 07/23/2017.

## 2017-09-04 NOTE — Telephone Encounter (Signed)
Pt states she is having break-through bleeding on Loestrin  on days she should not be bleeding.  She is requesting something different be called into [facility].

## 2017-09-05 MED ORDER — NORETHINDRONE ACET-ETHINYL EST 1.5-30 MG-MCG PO TABS: 1.0000 | ORAL_TABLET | Freq: Every day | ORAL | 11 refills | Status: DC

## 2017-09-05 NOTE — Telephone Encounter (Signed)
Every OCP has risk of break through bleeding, especially in the first three months. I have sent in a new pill with higher estrogen which may help this, but there is still a chance for break through bleeding. This tends to normalize over a few months.

## 2017-09-06 NOTE — Telephone Encounter (Signed)
Advised patient as below.  

## 2017-12-28 ENCOUNTER — Ambulatory Visit: Payer: BLUE CROSS/BLUE SHIELD | Admitting: Physician Assistant

## 2017-12-28 ENCOUNTER — Encounter: Payer: Self-pay | Admitting: Physician Assistant

## 2017-12-28 VITALS — BP 100/70 | HR 71 | Temp 98.7°F | Resp 16 | Wt 138.0 lb

## 2017-12-28 DIAGNOSIS — H66002 Acute suppurative otitis media without spontaneous rupture of ear drum, left ear: Secondary | ICD-10-CM | POA: Diagnosis not present

## 2017-12-28 MED ORDER — AMOXICILLIN 875 MG PO TABS
875.0000 mg | ORAL_TABLET | Freq: Two times a day (BID) | ORAL | 0 refills | Status: DC
Start: 2017-12-28 — End: 2018-01-08

## 2017-12-28 NOTE — Progress Notes (Signed)
Patient: Alicia Jensen Female    DOB: 03/24/1991   26 y.o.   MRN: [id number] Visit Date: 12/28/2017  Today's Provider: Margaretann Loveless, PA-C   Chief Complaint  Patient presents with  . Otalgia   Subjective:    Otalgia   There is pain in the left ear. This is a new problem. The current episode started 1 to 4 weeks ago. The problem occurs constantly. The problem has been unchanged. There has been no fever. The pain is at a severity of 4/10. The pain is mild. Associated symptoms include coughing, headaches, rhinorrhea (reports green nasal drainage only from left nostril) and a sore throat. Pertinent negatives include no abdominal pain, diarrhea, ear discharge, hearing loss, neck pain, rash or vomiting. Treatments tried: Benadryl. The treatment provided no relief.       Allergies  Allergen Reactions  . Ceclor [Cefaclor] Rash     Current Outpatient Medications:  Marland Kitchen  Multiple Vitamin (MULTIVITAMIN) tablet, Take 1 tablet by mouth daily., Disp: , Rfl:   Review of Systems  Constitutional: Positive for appetite change, chills, fatigue and fever.  HENT: Positive for ear pain, rhinorrhea (reports green nasal drainage only from left nostril), sinus pressure (left side) and sore throat. Negative for ear discharge and hearing loss.   Eyes: Positive for pain (left side).  Respiratory: Positive for cough.   Gastrointestinal: Negative for abdominal pain, diarrhea and vomiting.  Musculoskeletal: Negative for neck pain.  Skin: Negative for rash.  Neurological: Positive for headaches.    Social History   Tobacco Use  . Smoking status: Former Smoker    Types: Cigarettes    Last attempt to quit: 03/07/2014    Years since quitting: 3.8  . Smokeless tobacco: Never Used  . Tobacco comment: smoked socially  Substance Use Topics  . Alcohol use: No    Alcohol/week: 0.0 standard drinks   Objective:   BP 100/70   Pulse 71   Temp 98.7 F (37.1 C) (Oral)   Resp 16   Wt 138 lb (62.6  kg)   SpO2 99%   BMI 24.06 kg/m  Vitals:   12/28/17 1039  BP: 100/70  Pulse: 71  Resp: 16  Temp: 98.7 F (37.1 C)  TempSrc: Oral  SpO2: 99%  Weight: 138 lb (62.6 kg)     Physical Exam  Constitutional: She appears well-developed and well-nourished. No distress.  HENT:  Head: Normocephalic and atraumatic.  Right Ear: Hearing, tympanic membrane, external ear and ear canal normal.  Left Ear: Hearing, external ear and ear canal normal. There is swelling and tenderness. Tympanic membrane is erythematous and bulging. A middle ear effusion (opaque) is present.  Nose: Nose normal.  Mouth/Throat: Uvula is midline, oropharynx is clear and moist and mucous membranes are normal. No oropharyngeal exudate.  Eyes: Pupils are equal, round, and reactive to light. Conjunctivae are normal. Right eye exhibits no discharge. Left eye exhibits no discharge. No scleral icterus.  Neck: Normal range of motion. Neck supple. No tracheal deviation present. No thyromegaly present.  Cardiovascular: Normal rate, regular rhythm and normal heart sounds. Exam reveals no gallop and no friction rub.  No murmur heard. Pulmonary/Chest: Effort normal and breath sounds normal. No stridor. No respiratory distress. She has no wheezes. She has no rales.  Lymphadenopathy:    She has no cervical adenopathy.  Skin: Skin is warm and dry. She is not diaphoretic.  Vitals reviewed.       Assessment & Plan:  1. Non-recurrent acute suppurative otitis media of left ear without spontaneous rupture of tympanic membrane Worsening symptoms that have not responded to OTC medications. Will give amoxicillin as below. Continue allergy medications. Stay well hydrated and get plenty of rest. Call if no symptom improvement or if symptoms worsen. - amoxicillin (AMOXIL) 875 MG tablet; Take 1 tablet (875 mg total) by mouth 2 (two) times daily.  Dispense: 10 tablet; Refill: 0       Margaretann LovelessJennifer M Burnette, PA-C  [facility]

## 2017-12-28 NOTE — Patient Instructions (Signed)

## 2018-01-08 ENCOUNTER — Ambulatory Visit: Payer: BLUE CROSS/BLUE SHIELD | Admitting: Family Medicine

## 2018-01-08 ENCOUNTER — Telehealth: Payer: Self-pay | Admitting: Family Medicine

## 2018-01-08 MED ORDER — AZITHROMYCIN 250 MG PO TABS: ORAL_TABLET | ORAL | 0 refills | Status: AC

## 2018-01-08 NOTE — Telephone Encounter (Signed)
Pt called saying she seen Boneta Lucks on 9/24 and recd an antibiotic for ear infection.  She has taken that and is still having ear pain.  She wants to know if you can send another antibiotic in to the pharmacy  [facility]  She did make an appt for 430 this afternoon incase you need to see her..  Please advise  CB#  [phone] before 1:20 after that [phone]  Thanks teri

## 2018-01-08 NOTE — Telephone Encounter (Signed)
Patient advised as below and verbally voiced understanding.  

## 2018-01-08 NOTE — Telephone Encounter (Signed)
Please advise 

## 2018-01-08 NOTE — Progress Notes (Deleted)
       Patient: Alicia Jensen Female    DOB: 01/11/1991   27 y.o.   MRN: [id number] Visit Date: 01/08/2018  Today's Provider: Mila Merry, MD   No chief complaint on file.  Subjective:    Otalgia   Pertinent negatives include no abdominal pain or vomiting.       Allergies  Allergen Reactions  . Ceclor [Cefaclor] Rash     Current Outpatient Medications:  .  amoxicillin (AMOXIL) 875 MG tablet, Take 1 tablet (875 mg total) by mouth 2 (two) times daily., Disp: 10 tablet, Rfl: 0 .  Multiple Vitamin (MULTIVITAMIN) tablet, Take 1 tablet by mouth daily., Disp: , Rfl:   Review of Systems  Constitutional: Negative for appetite change, chills, fatigue and fever.  HENT: Positive for ear pain.   Respiratory: Negative for chest tightness and shortness of breath.   Cardiovascular: Negative for chest pain and palpitations.  Gastrointestinal: Negative for abdominal pain, nausea and vomiting.  Neurological: Negative for dizziness and weakness.    Social History   Tobacco Use  . Smoking status: Former Smoker    Types: Cigarettes    Last attempt to quit: 03/07/2014    Years since quitting: 3.8  . Smokeless tobacco: Never Used  . Tobacco comment: smoked socially  Substance Use Topics  . Alcohol use: No    Alcohol/week: 0.0 standard drinks   Objective:   There were no vitals taken for this visit. There were no vitals filed for this visit.   Physical Exam      Assessment & Plan:           Mila Merry, MD  [facility]

## 2018-01-08 NOTE — Telephone Encounter (Signed)
Can change to zpack. Have sent prescription [facility]. O.v. If not better when finished with zpack.

## 2018-01-28 ENCOUNTER — Ambulatory Visit: Payer: BLUE CROSS/BLUE SHIELD | Admitting: Family Medicine

## 2018-01-28 ENCOUNTER — Encounter: Payer: Self-pay | Admitting: Family Medicine

## 2018-01-28 VITALS — BP 110/70 | HR 70 | Temp 98.4°F | Resp 16 | Wt 135.8 lb

## 2018-01-28 DIAGNOSIS — G8929 Other chronic pain: Secondary | ICD-10-CM

## 2018-01-28 DIAGNOSIS — H9202 Otalgia, left ear: Secondary | ICD-10-CM

## 2018-01-28 DIAGNOSIS — H9312 Tinnitus, left ear: Secondary | ICD-10-CM

## 2018-01-28 NOTE — Progress Notes (Signed)
       Patient: Alicia Jensen Female    DOB: 01/03/1991   26 y.o.   MRN: [id number] Visit Date: 01/28/2018  Today's Provider: Mila Merryonald Aubery Douthat, MD   Chief Complaint  Patient presents with  . Recurrent Otitis   Subjective:    Otalgia   There is pain in the left ear. This is a recurrent problem. The current episode started more than 1 month ago. The problem occurs constantly. The problem has been gradually worsening (patient states today that pain has moved down to both of her ears and radiating into her neck). There has been no fever. The pain is moderate. Associated symptoms include headaches, hearing loss and neck pain. Pertinent negatives include no abdominal pain, coughing, diarrhea, ear discharge, rash, rhinorrhea, sore throat or vomiting. She has tried antibiotics for the symptoms. The treatment provided no relief.  she was initially prescribed amoxicillin by Antony ContrasJenni on 8-24 and had no improvement at all. We then sent in prescription for azithromycin on 01-08-2018 again with no improvement in sx. She states she has also had some ringing in ears worse on left. No fevers, chills, or sweats. No other URI or allergy sx.      Allergies  Allergen Reactions  . Ceclor [Cefaclor] Rash     Current Outpatient Medications:  Marland Kitchen.  Multiple Vitamin (MULTIVITAMIN) tablet, Take 1 tablet by mouth daily., Disp: , Rfl:   Review of Systems  HENT: Positive for ear pain and hearing loss. Negative for ear discharge, rhinorrhea and sore throat.   Respiratory: Negative for cough.   Gastrointestinal: Negative for abdominal pain, diarrhea and vomiting.  Musculoskeletal: Positive for neck pain.  Skin: Negative for rash.  Neurological: Positive for headaches.    Social History   Tobacco Use  . Smoking status: Former Smoker    Types: Cigarettes    Last attempt to quit: 03/07/2014    Years since quitting: 3.8  . Smokeless tobacco: Never Used  . Tobacco comment: smoked socially  Substance Use Topics  .  Alcohol use: No    Alcohol/week: 0.0 standard drinks   Objective:   BP 110/70   Pulse 70   Temp 98.4 F (36.9 C) (Oral)   Resp 16   Wt 135 lb 12.8 oz (61.6 kg)   BMI 23.68 kg/m  Vitals:   01/28/18 1449  BP: 110/70  Pulse: 70  Resp: 16  Temp: 98.4 F (36.9 C)  TempSrc: Oral  Weight: 135 lb 12.8 oz (61.6 kg)     Physical Exam  General Appearance:    Alert, cooperative, no distress  HENT:   bilateral TM normal without fluid or infection, neck without nodes, throat normal without erythema or exudate and sinuses nontender. Small amount of cerumen on left, but not obstructed.           Assessment & Plan:     1. Chronic left ear pain  - Ambulatory referral to ENT  2. Tinnitus of left ear  - Ambulatory referral to ENT  Normal exam today and not improvement after treatment with amoxicillin and azithromycin.        Mila Merryonald Laquinda Moller, MD  [facility]

## 2018-02-13 DIAGNOSIS — H9312 Tinnitus, left ear: Secondary | ICD-10-CM | POA: Diagnosis not present

## 2018-02-13 DIAGNOSIS — M26602 Left temporomandibular joint disorder, unspecified: Secondary | ICD-10-CM | POA: Diagnosis not present

## 2018-02-13 DIAGNOSIS — H93292 Other abnormal auditory perceptions, left ear: Secondary | ICD-10-CM | POA: Diagnosis not present

## 2019-01-27 ENCOUNTER — Other Ambulatory Visit: Payer: Self-pay

## 2019-01-27 ENCOUNTER — Ambulatory Visit (INDEPENDENT_AMBULATORY_CARE_PROVIDER_SITE_OTHER): Payer: BC Managed Care – PPO | Admitting: Physician Assistant

## 2019-01-27 DIAGNOSIS — R12 Heartburn: Secondary | ICD-10-CM

## 2019-01-27 MED ORDER — FAMOTIDINE 20 MG PO TABS: 20.0000 mg | ORAL_TABLET | Freq: Every day | ORAL | 0 refills | Status: AC

## 2019-01-27 NOTE — Progress Notes (Signed)
Patient: Alicia Jensen Female    DOB: 1990/12/14   27 y.o.   MRN: [id number] Visit Date: 01/28/2019  Today's Provider: Trey Sailors, PA-C   Chief Complaint  Patient presents with  . Abdominal Pain   Subjective:    Virtual Visit via Video Note  I connected with Alicia Jensen on 01/28/19 at 10:40 AM EDT by a video enabled telemedicine application and verified that I am speaking with the correct person using two identifiers.   I discussed the limitations of evaluation and management by telemedicine and the availability of in person appointments. The patient expressed understanding and agreed to proceed.  Patient location: home Provider location: [facility]/home office  Persons involved in the visit: patient, provider    Abdominal Pain This is a chronic problem. Episode onset: Started about two months ago. The problem occurs constantly. The problem has been gradually worsening. The pain is located in the generalized abdominal region. The quality of the pain is burning. The abdominal pain does not radiate. Associated symptoms include diarrhea and nausea. Pertinent negatives include no constipation, fever or vomiting. She has tried proton pump inhibitors for the symptoms. The treatment provided no relief.   Mid epigastric pain that worsens with coffee. Moving around makes it worse. Feels slightly nauseated. Some diarrhea for about one week. No constipation. Denies unintended weight loss.  Allergies  Allergen Reactions  . Ceclor [Cefaclor] Rash     Current Outpatient Medications:  Marland Kitchen  Multiple Vitamin (MULTIVITAMIN) tablet, Take 1 tablet by mouth daily., Disp: , Rfl:  .  vitamin B-12 (CYANOCOBALAMIN) 500 MCG tablet, Take 500 mcg by mouth daily., Disp: , Rfl:  .  famotidine (PEPCID) 20 MG tablet, Take 1 tablet (20 mg total) by mouth daily., Disp: 90 tablet, Rfl: 0  Review of Systems  Constitutional: Negative.  Negative for fever.  Gastrointestinal: Positive  for abdominal pain, diarrhea and nausea. Negative for abdominal distention, anal bleeding, blood in stool, constipation, rectal pain and vomiting.    Social History   Tobacco Use  . Smoking status: Former Smoker    Types: Cigarettes    Quit date: 03/07/2014    Years since quitting: 4.8  . Smokeless tobacco: Never Used  . Tobacco comment: smoked socially  Substance Use Topics  . Alcohol use: No    Alcohol/week: 0.0 standard drinks      Objective:   There were no vitals taken for this visit. There were no vitals filed for this visit.There is no height or weight on file to calculate BMI.   Physical Exam Constitutional:      Appearance: She is well-developed.  Neurological:     Mental Status: She is alert.  Psychiatric:        Mood and Affect: Mood normal.        Behavior: Behavior normal.      No results found for any visits on 01/27/19.     Assessment & Plan    1. Heartburn  Signs and symptoms consisted with acid reflux. Counseled on avoiding trigger foods, taking TUMS PRN and using pepcid 1 tablet 30 minutes before first meal of the day on an empty stomach. If not effective, can increase to one pill twice daily, both 30 minutes prior to mealtime.  - famotidine (PEPCID) 20 MG tablet; Take 1 tablet (20 mg total) by mouth daily.  Dispense: 90 tablet; Refill: 0  The entirety of the information documented in the History of Present Illness, Review of  Systems and Physical Exam were personally obtained by me. Portions of this information were initially documented by Alicia Jensen, CMA and reviewed by me for thoroughness and accuracy.      Alicia Post, PA-C  [facility]

## 2019-01-28 NOTE — Patient Instructions (Signed)

## 2019-01-29 ENCOUNTER — Encounter: Payer: BLUE CROSS/BLUE SHIELD | Admitting: Certified Nurse Midwife

## 2019-03-04 DIAGNOSIS — Z79899 Other long term (current) drug therapy: Secondary | ICD-10-CM | POA: Diagnosis not present

## 2019-03-04 DIAGNOSIS — E559 Vitamin D deficiency, unspecified: Secondary | ICD-10-CM | POA: Diagnosis not present

## 2019-03-04 DIAGNOSIS — R102 Pelvic and perineal pain: Secondary | ICD-10-CM | POA: Diagnosis not present

## 2019-03-04 DIAGNOSIS — N760 Acute vaginitis: Secondary | ICD-10-CM | POA: Diagnosis not present

## 2019-04-23 DIAGNOSIS — K625 Hemorrhage of anus and rectum: Secondary | ICD-10-CM | POA: Diagnosis not present

## 2019-04-23 DIAGNOSIS — R102 Pelvic and perineal pain: Secondary | ICD-10-CM | POA: Diagnosis not present

## 2019-04-23 DIAGNOSIS — N805 Endometriosis of intestine: Secondary | ICD-10-CM | POA: Diagnosis not present

## 2019-04-28 DIAGNOSIS — H9203 Otalgia, bilateral: Secondary | ICD-10-CM | POA: Diagnosis not present

## 2019-05-13 DIAGNOSIS — K625 Hemorrhage of anus and rectum: Secondary | ICD-10-CM | POA: Diagnosis not present

## 2019-05-13 DIAGNOSIS — R1031 Right lower quadrant pain: Secondary | ICD-10-CM | POA: Diagnosis not present

## 2019-05-13 DIAGNOSIS — Z01812 Encounter for preprocedural laboratory examination: Secondary | ICD-10-CM | POA: Diagnosis not present

## 2019-05-13 DIAGNOSIS — R1032 Left lower quadrant pain: Secondary | ICD-10-CM | POA: Diagnosis not present

## 2019-05-19 ENCOUNTER — Other Ambulatory Visit: Payer: BLUE CROSS/BLUE SHIELD

## 2019-05-19 DIAGNOSIS — Z20822 Contact with and (suspected) exposure to covid-19: Secondary | ICD-10-CM | POA: Diagnosis not present

## 2019-05-19 DIAGNOSIS — A084 Viral intestinal infection, unspecified: Secondary | ICD-10-CM | POA: Diagnosis not present

## 2019-06-19 DIAGNOSIS — Z01812 Encounter for preprocedural laboratory examination: Secondary | ICD-10-CM | POA: Diagnosis not present

## 2019-06-23 DIAGNOSIS — K625 Hemorrhage of anus and rectum: Secondary | ICD-10-CM | POA: Diagnosis not present

## 2019-06-23 DIAGNOSIS — K921 Melena: Secondary | ICD-10-CM | POA: Diagnosis not present

## 2019-06-23 DIAGNOSIS — K640 First degree hemorrhoids: Secondary | ICD-10-CM | POA: Diagnosis not present

## 2019-06-23 DIAGNOSIS — R1032 Left lower quadrant pain: Secondary | ICD-10-CM | POA: Diagnosis not present

## 2019-06-23 DIAGNOSIS — R1031 Right lower quadrant pain: Secondary | ICD-10-CM | POA: Diagnosis not present

## 2019-07-06 DIAGNOSIS — R1031 Right lower quadrant pain: Secondary | ICD-10-CM | POA: Diagnosis not present

## 2019-07-06 DIAGNOSIS — R1032 Left lower quadrant pain: Secondary | ICD-10-CM | POA: Diagnosis not present

## 2019-07-06 DIAGNOSIS — R195 Other fecal abnormalities: Secondary | ICD-10-CM | POA: Diagnosis not present

## 2019-07-06 DIAGNOSIS — K648 Other hemorrhoids: Secondary | ICD-10-CM | POA: Diagnosis not present

## 2019-07-17 DIAGNOSIS — Z20828 Contact with and (suspected) exposure to other viral communicable diseases: Secondary | ICD-10-CM | POA: Diagnosis not present

## 2019-08-31 DIAGNOSIS — H04123 Dry eye syndrome of bilateral lacrimal glands: Secondary | ICD-10-CM | POA: Diagnosis not present

## 2019-08-31 DIAGNOSIS — H11432 Conjunctival hyperemia, left eye: Secondary | ICD-10-CM | POA: Diagnosis not present

## 2019-09-04 ENCOUNTER — Other Ambulatory Visit: Payer: Self-pay | Admitting: Student

## 2019-09-04 DIAGNOSIS — G8929 Other chronic pain: Secondary | ICD-10-CM

## 2019-09-04 DIAGNOSIS — R195 Other fecal abnormalities: Secondary | ICD-10-CM

## 2019-09-09 DIAGNOSIS — L700 Acne vulgaris: Secondary | ICD-10-CM | POA: Diagnosis not present

## 2019-09-09 DIAGNOSIS — B078 Other viral warts: Secondary | ICD-10-CM | POA: Diagnosis not present

## 2019-09-15 ENCOUNTER — Other Ambulatory Visit: Payer: Self-pay | Admitting: Student

## 2019-09-21 ENCOUNTER — Other Ambulatory Visit: Payer: Self-pay

## 2019-09-21 ENCOUNTER — Ambulatory Visit
Admission: RE | Admit: 2019-09-21 | Discharge: 2019-09-21 | Disposition: A | Discharge status: Home / Self Care | Payer: Self-pay | Source: Ambulatory Visit | Attending: Student | Admitting: Student

## 2019-09-21 DIAGNOSIS — R195 Other fecal abnormalities: Secondary | ICD-10-CM

## 2019-09-21 DIAGNOSIS — G8929 Other chronic pain: Secondary | ICD-10-CM

## 2019-09-21 DIAGNOSIS — R109 Unspecified abdominal pain: Secondary | ICD-10-CM

## 2019-09-21 MED ORDER — IOPAMIDOL (ISOVUE-300) INJECTION 61%
100.0000 mL | Freq: Once | INTRAVENOUS | Status: AC | PRN
  Administered 2019-09-21: 100 mL via INTRAVENOUS

## 2019-09-29 ENCOUNTER — Ambulatory Visit: Payer: Self-pay | Admitting: Dermatology

## 2019-10-13 DIAGNOSIS — Z20822 Contact with and (suspected) exposure to covid-19: Secondary | ICD-10-CM | POA: Diagnosis not present

## 2019-10-13 DIAGNOSIS — Z03818 Encounter for observation for suspected exposure to other biological agents ruled out: Secondary | ICD-10-CM | POA: Diagnosis not present

## 2019-10-14 ENCOUNTER — Telehealth: Payer: BC Managed Care – PPO | Admitting: Family Medicine

## 2019-10-14 ENCOUNTER — Telehealth (INDEPENDENT_AMBULATORY_CARE_PROVIDER_SITE_OTHER): Payer: BC Managed Care – PPO | Admitting: Physician Assistant

## 2019-10-14 DIAGNOSIS — J40 Bronchitis, not specified as acute or chronic: Secondary | ICD-10-CM

## 2019-10-14 DIAGNOSIS — Z20822 Contact with and (suspected) exposure to covid-19: Secondary | ICD-10-CM | POA: Diagnosis not present

## 2019-10-14 DIAGNOSIS — R062 Wheezing: Secondary | ICD-10-CM

## 2019-10-14 DIAGNOSIS — R059 Cough, unspecified: Secondary | ICD-10-CM

## 2019-10-14 DIAGNOSIS — R05 Cough: Secondary | ICD-10-CM

## 2019-10-14 MED ORDER — PREDNISONE 10 MG PO TABS: ORAL_TABLET | ORAL | 0 refills | Status: AC

## 2019-10-14 NOTE — Progress Notes (Signed)
MyChart Video Visit    Virtual Visit via Video Note   This visit type was conducted due to national recommendations for restrictions regarding the COVID-19 Pandemic (e.g. social distancing) in an effort to limit this patient's exposure and mitigate transmission in our community. This patient is at least at moderate risk for complications without adequate follow up. This format is felt to be most appropriate for this patient at this time. Physical exam was limited by quality of the video and audio technology used for the visit.   Patient location: Home Provider location: Office   Patient: Alicia Jensen   DOB: Sep 17, 1990   29 y.o. Female  MRN: [id number] Visit Date: 10/14/2019  Today's healthcare provider: Trinna Post, PA-C   Chief Complaint  Patient presents with  . URI  I,Shaundra Fullam M Laelynn Blizzard,acting as a scribe for [employer], PA-C.,have documented all relevant documentation on the behalf of Trinna Post, PA-C,as directed by  Trinna Post, PA-C while in the presence of Trinna Post, PA-C.  Subjective    URI  This is a new problem. The current episode started in the past 7 days. The problem has been unchanged. The maximum temperature recorded prior to her arrival was 100.4 - 100.9 F. Associated symptoms include congestion, coughing, ear pain, headaches, rhinorrhea, sinus pain, a sore throat and wheezing. Pertinent negatives include no nausea. She has tried acetaminophen and increased fluids (Dayquil, ibuprofen, nasal spray.) for the symptoms. The treatment provided mild relief.    Patient states that she had a rapid COVID test on yesterday,10/13/2019 and it showed negative. Left side mainly painful. Patient also reports loss of smell. Friday morning symptoms started. Has not been vaccinated for COVID. Dayquil, ibuprofen and theraflu for treatments which have helped somewhat. Reports some trouble breathing through nose.    Medications: Outpatient Medications Prior  to Visit  Medication Sig  . famotidine (PEPCID) 20 MG tablet Take 1 tablet (20 mg total) by mouth daily.  . Multiple Vitamin (MULTIVITAMIN) tablet Take 1 tablet by mouth daily.  . vitamin B-12 (CYANOCOBALAMIN) 500 MCG tablet Take 500 mcg by mouth daily.   No facility-administered medications prior to visit.    Review of Systems  Constitutional: Positive for fever (100.0 yesterday,10/13/2019). Negative for chills and fatigue.  HENT: Positive for congestion, ear pain, postnasal drip, rhinorrhea, sinus pressure, sinus pain and sore throat.   Respiratory: Positive for cough, shortness of breath and wheezing. Negative for chest tightness.   Gastrointestinal: Negative for nausea.  Neurological: Positive for headaches.      Objective    There were no vitals taken for this visit.   Physical Exam Constitutional:      Appearance: Normal appearance. She is ill-appearing.  Pulmonary:     Effort: Pulmonary effort is normal. No respiratory distress.  Neurological:     Mental Status: She is alert.  Psychiatric:        Mood and Affect: Mood normal.        Behavior: Behavior normal.        Assessment & Plan    1. Cough  - predniSONE (DELTASONE) 10 MG tablet; Take 6 pills on day 1, 5 pills on day 2 and so on until complete.  Dispense: 21 tablet; Refill: 0  2. Suspected COVID-19 virus infection  - predniSONE (DELTASONE) 10 MG tablet; Take 6 pills on day 1, 5 pills on day 2 and so on until complete.  Dispense: 21 tablet; Refill: 0  3. Wheezing  -  predniSONE (DELTASONE) 10 MG tablet; Take 6 pills on day 1, 5 pills on day 2 and so on until complete.  Dispense: 21 tablet; Refill: 0    Return if symptoms worsen or fail to improve.     I discussed the assessment and treatment plan with the patient. The patient was provided an opportunity to ask questions and all were answered. The patient agreed with the plan and demonstrated an understanding of the instructions.   The patient was  advised to call back or seek an in-person evaluation if the symptoms worsen or if the condition fails to improve as anticipated.   ITrey Sailors, PA-C, have reviewed all documentation for this visit. The documentation on 10/14/19 for the exam, diagnosis, procedures, and orders are all accurate and complete.   Maryella Shivers [facility] [phone] (phone) [phone] (fax)  [facility]

## 2019-10-19 DIAGNOSIS — R102 Pelvic and perineal pain: Secondary | ICD-10-CM | POA: Diagnosis not present

## 2019-10-19 DIAGNOSIS — N809 Endometriosis, unspecified: Secondary | ICD-10-CM | POA: Diagnosis not present

## 2019-10-19 DIAGNOSIS — N83202 Unspecified ovarian cyst, left side: Secondary | ICD-10-CM | POA: Diagnosis not present

## 2019-11-25 DIAGNOSIS — Z03818 Encounter for observation for suspected exposure to other biological agents ruled out: Secondary | ICD-10-CM | POA: Diagnosis not present

## 2019-11-25 DIAGNOSIS — Z20822 Contact with and (suspected) exposure to covid-19: Secondary | ICD-10-CM | POA: Diagnosis not present

## 2019-12-26 DIAGNOSIS — Z03818 Encounter for observation for suspected exposure to other biological agents ruled out: Secondary | ICD-10-CM | POA: Diagnosis not present

## 2019-12-26 DIAGNOSIS — Z20822 Contact with and (suspected) exposure to covid-19: Secondary | ICD-10-CM | POA: Diagnosis not present

## 2020-01-27 DIAGNOSIS — R234 Changes in skin texture: Secondary | ICD-10-CM | POA: Diagnosis not present

## 2020-01-27 DIAGNOSIS — N6452 Nipple discharge: Secondary | ICD-10-CM | POA: Diagnosis not present

## 2020-06-15 DIAGNOSIS — Z20822 Contact with and (suspected) exposure to covid-19: Secondary | ICD-10-CM | POA: Diagnosis not present

## 2020-06-15 DIAGNOSIS — Z03818 Encounter for observation for suspected exposure to other biological agents ruled out: Secondary | ICD-10-CM | POA: Diagnosis not present

## 2020-06-17 DIAGNOSIS — Z20822 Contact with and (suspected) exposure to covid-19: Secondary | ICD-10-CM | POA: Diagnosis not present

## 2020-08-04 IMAGING — CT CT ABD-PELV W/ CM
1 of 2 series · 14 of 32 positions shown, 19 images · IV contrast (APPLIED)
Comparison: None.

CLINICAL DATA: Abdominal pain.

EXAM:
CT ABDOMEN AND PELVIS WITH CONTRAST
TECHNIQUE: Multidetector CT imaging of the abdomen and pelvis was performed
using the standard protocol following bolus administration of
intravenous contrast.
CONTRAST:  100mL PZVDER-V44 IOPAMIDOL (PZVDER-V44) INJECTION 61%

[Series 2: abd/pelvis w/cm · axial · 0.72mm/px · z∈[-448,-78]mm · 14 of 84 slices shown, 19 images]
[im 5/84  soft-tissue]
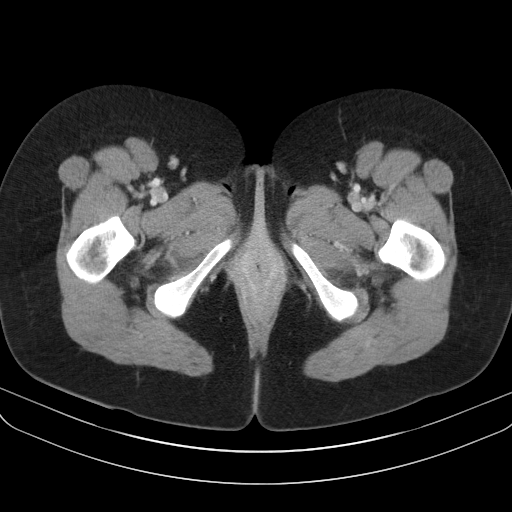
[im 5/84  bone]
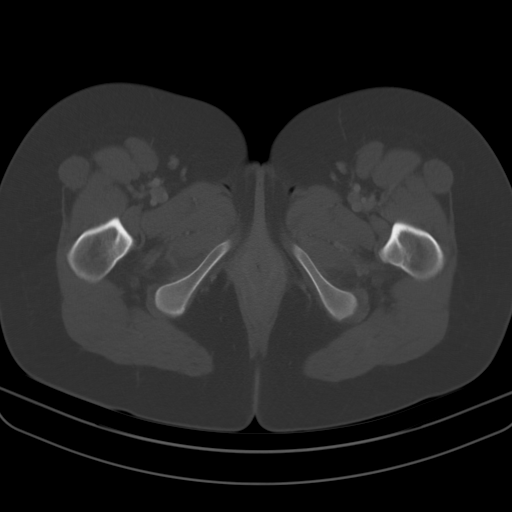
[im 10/84  soft-tissue]
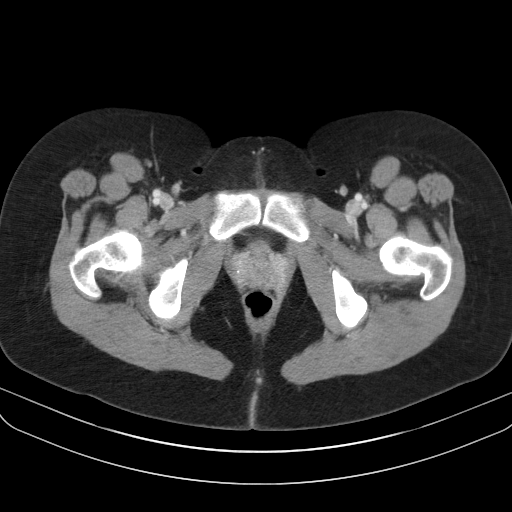
[im 19/84  soft-tissue]
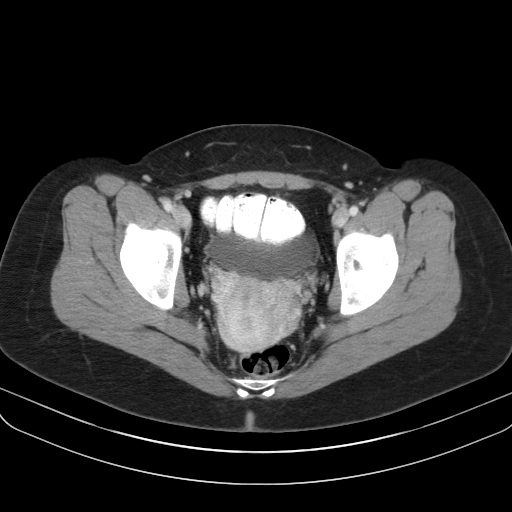
[im 24/84  soft-tissue]
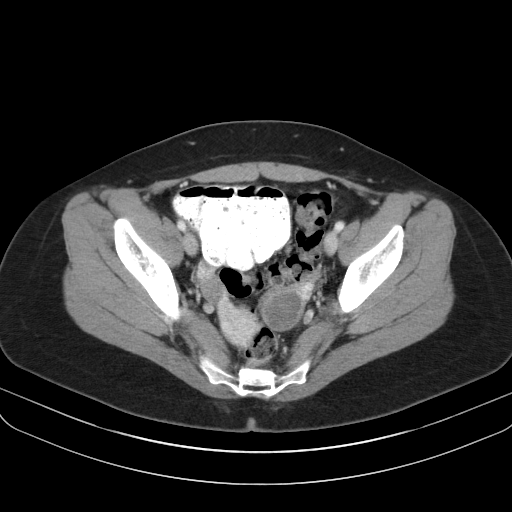
[im 28/84  soft-tissue]
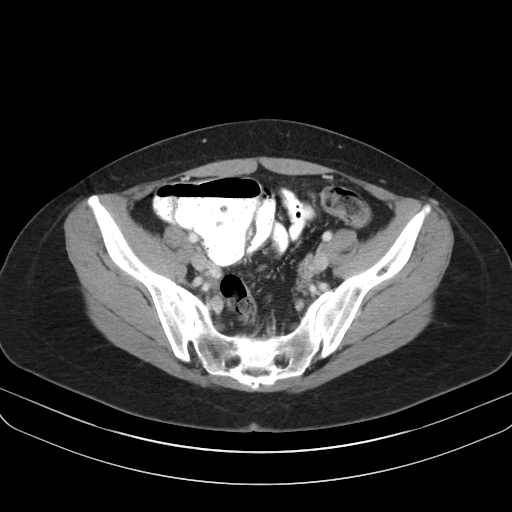
[im 37/84  soft-tissue]
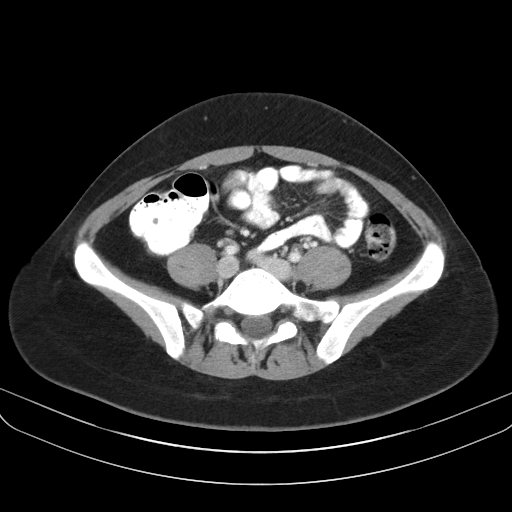
[im 42/84  soft-tissue]
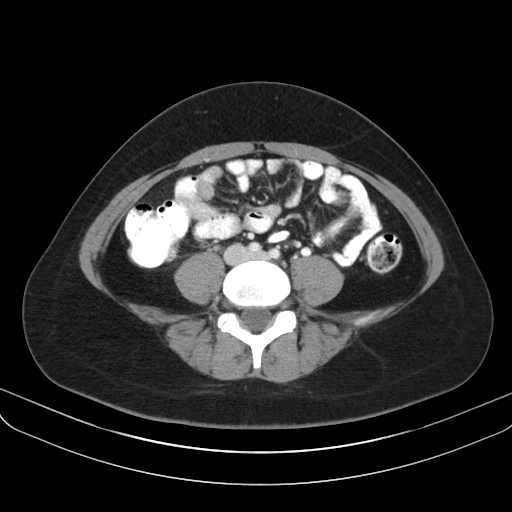
[im 47/84  soft-tissue]
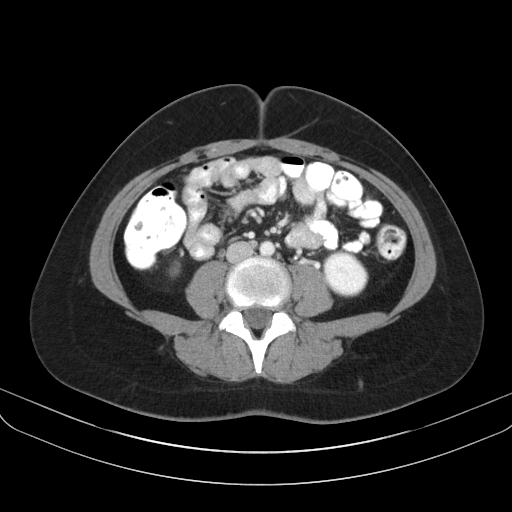
[im 56/84  soft-tissue]
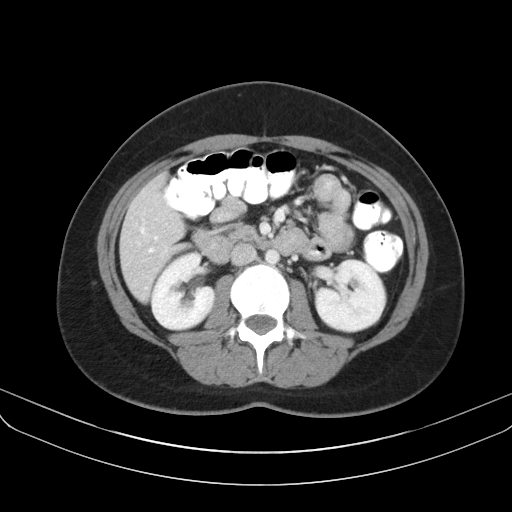
[im 56/84  bone]
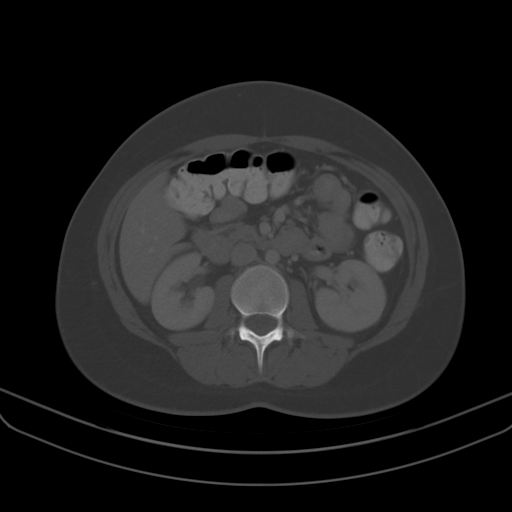
[im 60/84  soft-tissue]
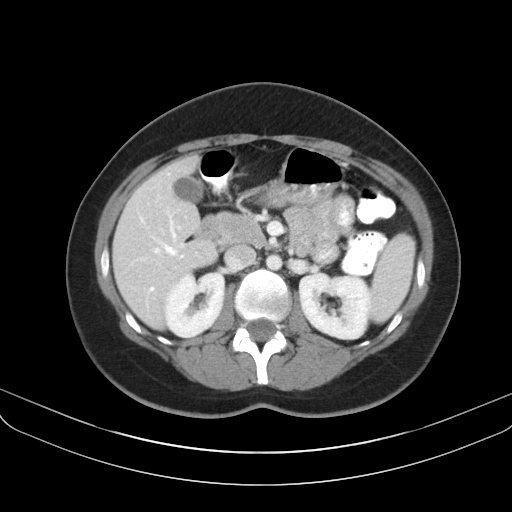
[im 65/84  soft-tissue]
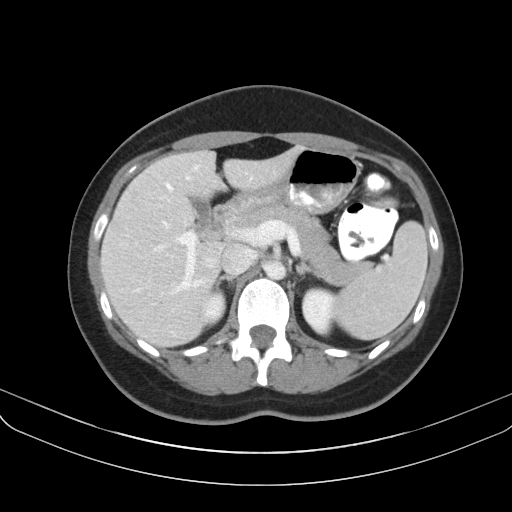
[im 65/84  lung]
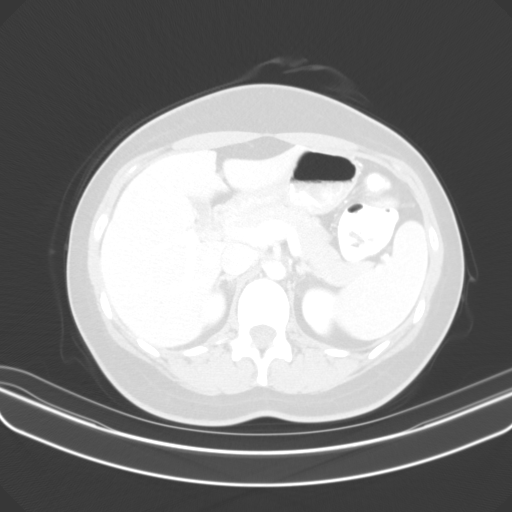
[im 70/84  lung]
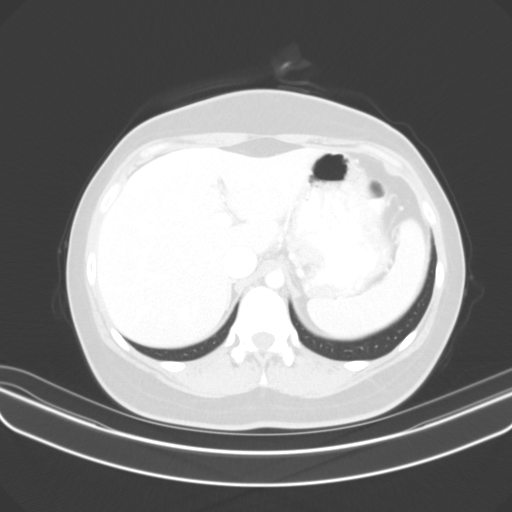
[im 74/84  soft-tissue]
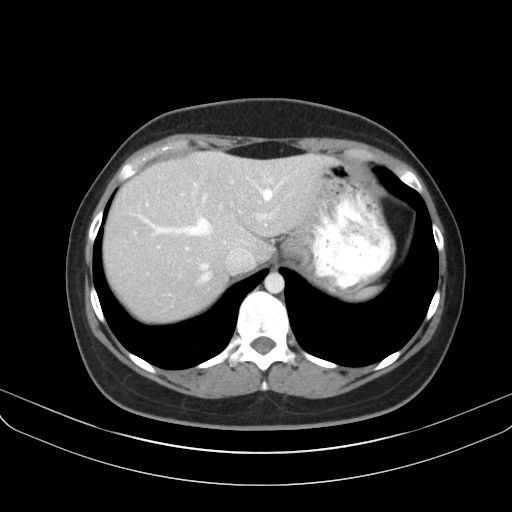
[im 74/84  lung]
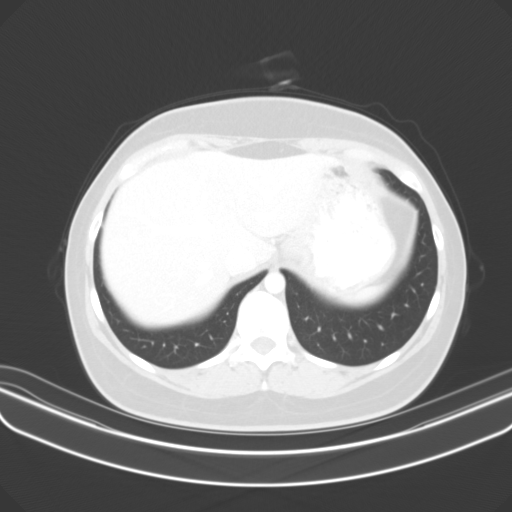
[im 79/84  soft-tissue]
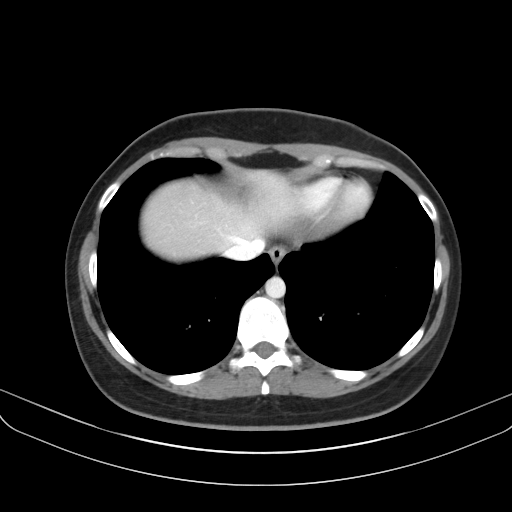
[im 79/84  lung]
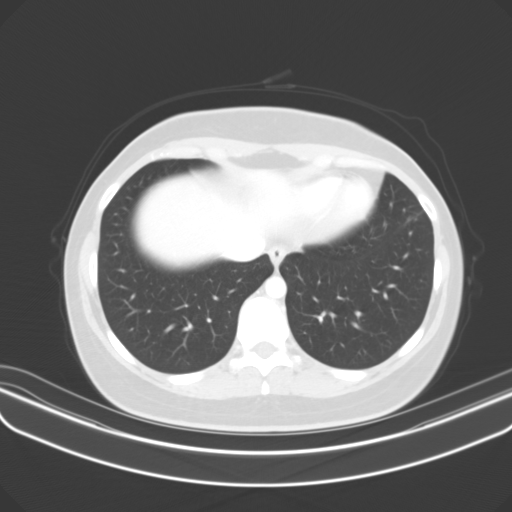

[14 of 32 positions shown; findings below may reference images not displayed]

FINDINGS: Lower chest: The lung bases are clear. The heart size is normal.

Hepatobiliary: The liver is normal. Normal gallbladder.There is no
biliary ductal dilation.

Pancreas: Normal contours without ductal dilatation. No
peripancreatic fluid collection.

Spleen: Unremarkable.

Adrenals/Urinary Tract:

--Adrenal glands: Unremarkable.

--Right kidney/ureter: No hydronephrosis or radiopaque kidney
stones.

--Left kidney/ureter: No hydronephrosis or radiopaque kidney stones.

--Urinary bladder: Unremarkable.

Stomach/Bowel:

--Stomach/Duodenum: No hiatal hernia or other gastric abnormality.
Normal duodenal course and caliber.

--Small bowel: Unremarkable.

--Colon: There is an above average amount of stool in the colon.

--Appendix: Normal.

Vascular/Lymphatic: Normal course and caliber of the major abdominal
vessels.

--No retroperitoneal lymphadenopathy.

--No mesenteric lymphadenopathy.

--No pelvic or inguinal lymphadenopathy.

Reproductive: There is a complex left ovarian cystic mass measuring
approximately 3.6 by 3.4 cm. This mass contains several internal
septations some of which may demonstrate enhancement.

Other: No ascites or free air. The abdominal wall is normal.

Musculoskeletal. No acute displaced fractures.
IMPRESSION: 1. No acute abnormality.
2. Above average stool burden.
3. There is a 3.6 cm complex cystic mass involving the left ovary. A
follow-up ultrasound is recommended in 6-12 weeks for further
evaluation of this finding.

## 2023-12-09 ENCOUNTER — Other Ambulatory Visit: Payer: Self-pay | Admitting: Medical Genetics

## 2024-02-14 ENCOUNTER — Other Ambulatory Visit: Payer: Self-pay | Admitting: Genetic Counselor

## 2024-02-14 DIAGNOSIS — Z006 Encounter for examination for normal comparison and control in clinical research program: Secondary | ICD-10-CM

## 2024-06-11 ENCOUNTER — Ambulatory Visit: Payer: Self-pay | Admitting: Family Medicine
